# Patient Record
Sex: Female | Born: 1945 | Race: White | Hispanic: No | Marital: Married | Smoking: Current every day smoker
Health system: Southern US, Community
[De-identification: ages and names within clinical notes are randomized; demographics above are authoritative.]

## PROBLEM LIST (undated history)

## (undated) DIAGNOSIS — E538 Deficiency of other specified B group vitamins: Secondary | ICD-10-CM

## (undated) DIAGNOSIS — N3000 Acute cystitis without hematuria: Secondary | ICD-10-CM

## (undated) DIAGNOSIS — R7309 Other abnormal glucose: Secondary | ICD-10-CM

## (undated) DIAGNOSIS — D1800 Hemangioma unspecified site: Secondary | ICD-10-CM

## (undated) DIAGNOSIS — M810 Age-related osteoporosis without current pathological fracture: Secondary | ICD-10-CM

## (undated) DIAGNOSIS — K5730 Diverticulosis of large intestine without perforation or abscess without bleeding: Secondary | ICD-10-CM

## (undated) DIAGNOSIS — H9209 Otalgia, unspecified ear: Secondary | ICD-10-CM

## (undated) DIAGNOSIS — R0989 Other specified symptoms and signs involving the circulatory and respiratory systems: Secondary | ICD-10-CM

## (undated) DIAGNOSIS — IMO0001 Reserved for inherently not codable concepts without codable children: Secondary | ICD-10-CM

## (undated) DIAGNOSIS — I10 Essential (primary) hypertension: Secondary | ICD-10-CM

## (undated) DIAGNOSIS — J449 Chronic obstructive pulmonary disease, unspecified: Secondary | ICD-10-CM

## (undated) DIAGNOSIS — K589 Irritable bowel syndrome without diarrhea: Secondary | ICD-10-CM

## (undated) DIAGNOSIS — F17200 Nicotine dependence, unspecified, uncomplicated: Secondary | ICD-10-CM

## (undated) DIAGNOSIS — K219 Gastro-esophageal reflux disease without esophagitis: Secondary | ICD-10-CM

## (undated) DIAGNOSIS — J309 Allergic rhinitis, unspecified: Secondary | ICD-10-CM

## (undated) DIAGNOSIS — K648 Other hemorrhoids: Secondary | ICD-10-CM

## (undated) DIAGNOSIS — R079 Chest pain, unspecified: Secondary | ICD-10-CM

## (undated) DIAGNOSIS — G5600 Carpal tunnel syndrome, unspecified upper limb: Secondary | ICD-10-CM

## (undated) DIAGNOSIS — M797 Fibromyalgia: Secondary | ICD-10-CM

## (undated) DIAGNOSIS — R209 Unspecified disturbances of skin sensation: Secondary | ICD-10-CM

## (undated) HISTORY — DX: Deficiency of other specified B group vitamins: E53.8

## (undated) HISTORY — DX: Other hemorrhoids: K64.8

## (undated) HISTORY — DX: Irritable bowel syndrome without diarrhea: K58.9

## (undated) HISTORY — PX: ABDOMINAL HYSTERECTOMY: SHX81

## (undated) HISTORY — PX: FOOT OSTEOTOMY: SHX957

## (undated) HISTORY — DX: Unspecified disturbances of skin sensation: R20.9

## (undated) HISTORY — DX: Otalgia, unspecified ear: H92.09

## (undated) HISTORY — DX: Gastro-esophageal reflux disease without esophagitis: K21.9

## (undated) HISTORY — DX: Diverticulosis of large intestine without perforation or abscess without bleeding: K57.30

## (undated) HISTORY — PX: SHOULDER SURGERY: SHX246

## (undated) HISTORY — DX: Carpal tunnel syndrome, unspecified upper limb: G56.00

## (undated) HISTORY — DX: Other abnormal glucose: R73.09

## (undated) HISTORY — DX: Fibromyalgia: M79.7

## (undated) HISTORY — DX: Irritable bowel syndrome, unspecified: K58.9

## (undated) HISTORY — DX: Essential (primary) hypertension: I10

## (undated) HISTORY — DX: Nicotine dependence, unspecified, uncomplicated: F17.200

## (undated) HISTORY — DX: Acute cystitis without hematuria: N30.00

## (undated) HISTORY — DX: Chronic obstructive pulmonary disease, unspecified: J44.9

## (undated) HISTORY — DX: Chest pain, unspecified: R07.9

## (undated) HISTORY — PX: CHOLECYSTECTOMY: SHX55

## (undated) HISTORY — DX: Reserved for inherently not codable concepts without codable children: IMO0001

## (undated) HISTORY — DX: Allergic rhinitis, unspecified: J30.9

## (undated) HISTORY — DX: Hemangioma unspecified site: D18.00

## (undated) HISTORY — DX: Other specified symptoms and signs involving the circulatory and respiratory systems: R09.89

## (undated) HISTORY — DX: Age-related osteoporosis without current pathological fracture: M81.0

---

## 1988-11-14 HISTORY — PX: REDUCTION MAMMAPLASTY: SUR839

## 1998-10-28 ENCOUNTER — Encounter: Payer: Self-pay | Admitting: Obstetrics and Gynecology

## 1998-10-28 ENCOUNTER — Ambulatory Visit (HOSPITAL_COMMUNITY): Admission: RE | Admit: 1998-10-28 | Discharge: 1998-10-28 | Payer: Self-pay | Admitting: Obstetrics and Gynecology

## 1999-11-01 ENCOUNTER — Ambulatory Visit (HOSPITAL_COMMUNITY): Admission: RE | Admit: 1999-11-01 | Discharge: 1999-11-01 | Payer: Self-pay | Admitting: Obstetrics and Gynecology

## 1999-11-01 ENCOUNTER — Encounter: Payer: Self-pay | Admitting: Obstetrics and Gynecology

## 2000-11-01 ENCOUNTER — Ambulatory Visit (HOSPITAL_COMMUNITY): Admission: RE | Admit: 2000-11-01 | Discharge: 2000-11-01 | Payer: Self-pay | Admitting: Obstetrics and Gynecology

## 2000-11-01 ENCOUNTER — Encounter: Payer: Self-pay | Admitting: Obstetrics and Gynecology

## 2001-03-06 ENCOUNTER — Encounter: Payer: Self-pay | Admitting: Obstetrics and Gynecology

## 2001-03-06 ENCOUNTER — Encounter: Admission: RE | Admit: 2001-03-06 | Discharge: 2001-03-06 | Payer: Self-pay | Admitting: Obstetrics and Gynecology

## 2001-11-02 ENCOUNTER — Ambulatory Visit (HOSPITAL_COMMUNITY): Admission: RE | Admit: 2001-11-02 | Discharge: 2001-11-02 | Payer: Self-pay | Admitting: Internal Medicine

## 2001-11-02 ENCOUNTER — Encounter: Payer: Self-pay | Admitting: Internal Medicine

## 2002-02-01 ENCOUNTER — Encounter: Admission: RE | Admit: 2002-02-01 | Discharge: 2002-05-02 | Payer: Self-pay | Admitting: Orthopedic Surgery

## 2002-04-05 ENCOUNTER — Other Ambulatory Visit: Admission: RE | Admit: 2002-04-05 | Discharge: 2002-04-05 | Payer: Self-pay | Admitting: Obstetrics and Gynecology

## 2002-10-09 ENCOUNTER — Ambulatory Visit (HOSPITAL_COMMUNITY): Admission: RE | Admit: 2002-10-09 | Discharge: 2002-10-09 | Payer: Self-pay | Admitting: Gastroenterology

## 2002-10-21 ENCOUNTER — Encounter: Payer: Self-pay | Admitting: Gastroenterology

## 2002-10-21 ENCOUNTER — Ambulatory Visit (HOSPITAL_COMMUNITY): Admission: RE | Admit: 2002-10-21 | Discharge: 2002-10-21 | Payer: Self-pay | Admitting: Gastroenterology

## 2002-11-04 ENCOUNTER — Ambulatory Visit (HOSPITAL_COMMUNITY): Admission: RE | Admit: 2002-11-04 | Discharge: 2002-11-04 | Payer: Self-pay | Admitting: Obstetrics and Gynecology

## 2002-11-04 ENCOUNTER — Encounter: Payer: Self-pay | Admitting: Obstetrics and Gynecology

## 2002-12-04 ENCOUNTER — Encounter (INDEPENDENT_AMBULATORY_CARE_PROVIDER_SITE_OTHER): Payer: Self-pay | Admitting: *Deleted

## 2002-12-04 ENCOUNTER — Ambulatory Visit (HOSPITAL_COMMUNITY): Admission: RE | Admit: 2002-12-04 | Discharge: 2002-12-05 | Payer: Self-pay | Admitting: Surgery

## 2002-12-04 ENCOUNTER — Encounter: Payer: Self-pay | Admitting: Surgery

## 2003-03-31 ENCOUNTER — Ambulatory Visit (HOSPITAL_COMMUNITY): Admission: RE | Admit: 2003-03-31 | Discharge: 2003-03-31 | Payer: Self-pay | Admitting: Gastroenterology

## 2003-03-31 ENCOUNTER — Encounter (INDEPENDENT_AMBULATORY_CARE_PROVIDER_SITE_OTHER): Payer: Self-pay | Admitting: *Deleted

## 2003-03-31 ENCOUNTER — Encounter (INDEPENDENT_AMBULATORY_CARE_PROVIDER_SITE_OTHER): Payer: Self-pay | Admitting: Specialist

## 2003-06-10 ENCOUNTER — Other Ambulatory Visit: Admission: RE | Admit: 2003-06-10 | Discharge: 2003-06-10 | Payer: Self-pay | Admitting: Obstetrics and Gynecology

## 2003-11-06 ENCOUNTER — Ambulatory Visit (HOSPITAL_COMMUNITY): Admission: RE | Admit: 2003-11-06 | Discharge: 2003-11-06 | Payer: Self-pay | Admitting: Obstetrics and Gynecology

## 2004-06-23 ENCOUNTER — Other Ambulatory Visit: Admission: RE | Admit: 2004-06-23 | Discharge: 2004-06-23 | Payer: Self-pay | Admitting: Obstetrics and Gynecology

## 2004-08-04 ENCOUNTER — Encounter: Admission: RE | Admit: 2004-08-04 | Discharge: 2004-08-04 | Payer: Self-pay | Admitting: Obstetrics and Gynecology

## 2004-10-06 ENCOUNTER — Emergency Department (HOSPITAL_COMMUNITY): Admission: EM | Admit: 2004-10-06 | Discharge: 2004-10-06 | Payer: Self-pay | Admitting: Family Medicine

## 2004-11-16 ENCOUNTER — Ambulatory Visit (HOSPITAL_COMMUNITY): Admission: RE | Admit: 2004-11-16 | Discharge: 2004-11-16 | Payer: Self-pay | Admitting: Obstetrics and Gynecology

## 2005-06-13 ENCOUNTER — Ambulatory Visit: Payer: Self-pay | Admitting: Internal Medicine

## 2005-07-12 ENCOUNTER — Ambulatory Visit: Payer: Self-pay | Admitting: Internal Medicine

## 2005-08-10 ENCOUNTER — Other Ambulatory Visit: Admission: RE | Admit: 2005-08-10 | Discharge: 2005-08-10 | Payer: Self-pay | Admitting: Obstetrics and Gynecology

## 2005-09-02 ENCOUNTER — Ambulatory Visit: Payer: Self-pay | Admitting: Internal Medicine

## 2005-11-23 ENCOUNTER — Ambulatory Visit (HOSPITAL_COMMUNITY): Admission: RE | Admit: 2005-11-23 | Discharge: 2005-11-23 | Payer: Self-pay | Admitting: Obstetrics and Gynecology

## 2006-02-20 ENCOUNTER — Emergency Department (HOSPITAL_COMMUNITY): Admission: EM | Admit: 2006-02-20 | Discharge: 2006-02-20 | Payer: Self-pay | Admitting: Family Medicine

## 2006-03-30 ENCOUNTER — Ambulatory Visit: Payer: Self-pay | Admitting: Internal Medicine

## 2006-04-03 ENCOUNTER — Ambulatory Visit: Payer: Self-pay | Admitting: Internal Medicine

## 2006-04-18 ENCOUNTER — Ambulatory Visit (HOSPITAL_COMMUNITY): Admission: RE | Admit: 2006-04-18 | Discharge: 2006-04-18 | Payer: Self-pay | Admitting: Gastroenterology

## 2006-05-24 ENCOUNTER — Encounter (INDEPENDENT_AMBULATORY_CARE_PROVIDER_SITE_OTHER): Payer: Self-pay | Admitting: *Deleted

## 2006-05-24 ENCOUNTER — Ambulatory Visit (HOSPITAL_COMMUNITY): Admission: RE | Admit: 2006-05-24 | Discharge: 2006-05-24 | Payer: Self-pay | Admitting: Gastroenterology

## 2006-05-24 LAB — HM COLONOSCOPY

## 2006-06-27 ENCOUNTER — Encounter: Payer: Self-pay | Admitting: Internal Medicine

## 2006-07-21 ENCOUNTER — Encounter (INDEPENDENT_AMBULATORY_CARE_PROVIDER_SITE_OTHER): Payer: Self-pay | Admitting: Specialist

## 2006-07-21 ENCOUNTER — Encounter (INDEPENDENT_AMBULATORY_CARE_PROVIDER_SITE_OTHER): Payer: Self-pay | Admitting: *Deleted

## 2006-07-21 ENCOUNTER — Ambulatory Visit (HOSPITAL_COMMUNITY): Admission: RE | Admit: 2006-07-21 | Discharge: 2006-07-21 | Payer: Self-pay | Admitting: Gastroenterology

## 2006-08-25 ENCOUNTER — Ambulatory Visit (HOSPITAL_COMMUNITY): Admission: RE | Admit: 2006-08-25 | Discharge: 2006-08-25 | Payer: Self-pay | Admitting: Obstetrics and Gynecology

## 2006-11-24 ENCOUNTER — Ambulatory Visit (HOSPITAL_COMMUNITY): Admission: RE | Admit: 2006-11-24 | Discharge: 2006-11-24 | Payer: Self-pay | Admitting: Obstetrics and Gynecology

## 2007-02-06 ENCOUNTER — Ambulatory Visit: Payer: Self-pay | Admitting: Internal Medicine

## 2007-02-06 LAB — CONVERTED CEMR LAB
ALT: 19 units/L (ref 0–40)
AST: 18 units/L (ref 0–37)
Alkaline Phosphatase: 92 units/L (ref 39–117)
BUN: 12 mg/dL (ref 6–23)
Basophils Relative: 1.5 % — ABNORMAL HIGH (ref 0.0–1.0)
Bilirubin, Direct: 0.1 mg/dL (ref 0.0–0.3)
CO2: 30 meq/L (ref 19–32)
Calcium: 9.1 mg/dL (ref 8.4–10.5)
Chloride: 105 meq/L (ref 96–112)
Crystals: NEGATIVE
Eosinophils Absolute: 0.3 10*3/uL (ref 0.0–0.6)
Eosinophils Relative: 5.4 % — ABNORMAL HIGH (ref 0.0–5.0)
GFR calc Af Amer: 132 mL/min
Glucose, Bld: 100 mg/dL — ABNORMAL HIGH (ref 70–99)
HCT: 39.8 % (ref 36.0–46.0)
Hemoglobin, Urine: NEGATIVE
Leukocytes, UA: NEGATIVE
MCV: 85.6 fL (ref 78.0–100.0)
Mucus, UA: NEGATIVE
Neutrophils Relative %: 52.4 % (ref 43.0–77.0)
Platelets: 238 10*3/uL (ref 150–400)
RBC / HPF: NONE SEEN
RBC: 4.65 M/uL (ref 3.87–5.11)
Total CHOL/HDL Ratio: 4
Total Protein, Urine: NEGATIVE mg/dL
Total Protein: 6.9 g/dL (ref 6.0–8.3)
VLDL: 30 mg/dL (ref 0–40)
WBC: 6 10*3/uL (ref 4.5–10.5)

## 2007-02-12 ENCOUNTER — Ambulatory Visit: Payer: Self-pay | Admitting: Internal Medicine

## 2007-02-13 ENCOUNTER — Ambulatory Visit: Payer: Self-pay | Admitting: Internal Medicine

## 2007-03-08 ENCOUNTER — Ambulatory Visit (HOSPITAL_COMMUNITY): Admission: RE | Admit: 2007-03-08 | Discharge: 2007-03-08 | Payer: Self-pay | Admitting: Internal Medicine

## 2007-03-16 ENCOUNTER — Ambulatory Visit: Payer: Self-pay | Admitting: Internal Medicine

## 2007-04-16 ENCOUNTER — Ambulatory Visit (HOSPITAL_COMMUNITY): Admission: RE | Admit: 2007-04-16 | Discharge: 2007-04-16 | Payer: Self-pay | Admitting: Neurosurgery

## 2007-07-13 ENCOUNTER — Emergency Department (HOSPITAL_COMMUNITY): Admission: EM | Admit: 2007-07-13 | Discharge: 2007-07-13 | Payer: Self-pay | Admitting: Emergency Medicine

## 2007-08-30 ENCOUNTER — Telehealth (INDEPENDENT_AMBULATORY_CARE_PROVIDER_SITE_OTHER): Payer: Self-pay | Admitting: *Deleted

## 2007-08-30 ENCOUNTER — Ambulatory Visit: Payer: Self-pay | Admitting: Internal Medicine

## 2007-08-30 LAB — CONVERTED CEMR LAB
Crystals: NEGATIVE
Mucus, UA: NEGATIVE
Urobilinogen, UA: 1 (ref 0.0–1.0)

## 2007-08-31 ENCOUNTER — Telehealth: Payer: Self-pay | Admitting: Internal Medicine

## 2007-08-31 DIAGNOSIS — N390 Urinary tract infection, site not specified: Secondary | ICD-10-CM

## 2007-09-25 ENCOUNTER — Encounter: Payer: Self-pay | Admitting: Internal Medicine

## 2007-09-25 DIAGNOSIS — IMO0001 Reserved for inherently not codable concepts without codable children: Secondary | ICD-10-CM

## 2007-09-25 DIAGNOSIS — K219 Gastro-esophageal reflux disease without esophagitis: Secondary | ICD-10-CM | POA: Insufficient documentation

## 2007-09-25 DIAGNOSIS — I10 Essential (primary) hypertension: Secondary | ICD-10-CM | POA: Insufficient documentation

## 2007-09-25 DIAGNOSIS — M810 Age-related osteoporosis without current pathological fracture: Secondary | ICD-10-CM

## 2007-09-25 HISTORY — DX: Reserved for inherently not codable concepts without codable children: IMO0001

## 2007-09-25 HISTORY — DX: Age-related osteoporosis without current pathological fracture: M81.0

## 2007-09-25 HISTORY — DX: Gastro-esophageal reflux disease without esophagitis: K21.9

## 2007-09-25 HISTORY — DX: Essential (primary) hypertension: I10

## 2007-09-28 ENCOUNTER — Telehealth: Payer: Self-pay | Admitting: Internal Medicine

## 2007-10-01 ENCOUNTER — Encounter: Payer: Self-pay | Admitting: Internal Medicine

## 2007-11-22 ENCOUNTER — Telehealth: Payer: Self-pay | Admitting: Internal Medicine

## 2007-11-23 ENCOUNTER — Telehealth: Payer: Self-pay | Admitting: Internal Medicine

## 2007-11-23 ENCOUNTER — Ambulatory Visit: Payer: Self-pay | Admitting: Internal Medicine

## 2007-11-23 LAB — CONVERTED CEMR LAB
Crystals: NEGATIVE
Nitrite: POSITIVE — AB
Specific Gravity, Urine: 1.025 (ref 1.000–1.03)
Total Protein, Urine: 100 mg/dL — AB

## 2007-11-26 ENCOUNTER — Ambulatory Visit (HOSPITAL_COMMUNITY): Admission: RE | Admit: 2007-11-26 | Discharge: 2007-11-26 | Payer: Self-pay | Admitting: Obstetrics and Gynecology

## 2007-12-18 ENCOUNTER — Encounter: Payer: Self-pay | Admitting: Internal Medicine

## 2007-12-25 ENCOUNTER — Ambulatory Visit: Payer: Self-pay | Admitting: Internal Medicine

## 2007-12-25 DIAGNOSIS — N3000 Acute cystitis without hematuria: Secondary | ICD-10-CM

## 2007-12-25 HISTORY — DX: Acute cystitis without hematuria: N30.00

## 2007-12-25 LAB — CONVERTED CEMR LAB
Bilirubin Urine: NEGATIVE
Nitrite: NEGATIVE
RBC / HPF: NONE SEEN
Specific Gravity, Urine: <=1.005 (ref 1.000–1.03)
pH: 6.5 (ref 5.0–8.0)

## 2008-01-15 ENCOUNTER — Telehealth: Payer: Self-pay | Admitting: Internal Medicine

## 2008-01-24 ENCOUNTER — Ambulatory Visit (HOSPITAL_COMMUNITY): Admission: RE | Admit: 2008-01-24 | Discharge: 2008-01-24 | Payer: Self-pay | Admitting: Urology

## 2008-02-18 ENCOUNTER — Ambulatory Visit: Payer: Self-pay | Admitting: Internal Medicine

## 2008-02-20 LAB — CONVERTED CEMR LAB
AST: 19 units/L (ref 0–37)
Albumin: 3.8 g/dL (ref 3.5–5.2)
BUN: 14 mg/dL (ref 6–23)
Basophils Relative: 0.9 % (ref 0.0–1.0)
Cholesterol: 201 mg/dL (ref 0–200)
Creatinine, Ser: 0.7 mg/dL (ref 0.4–1.2)
Crystals: NEGATIVE
Direct LDL: 137.6 mg/dL
Eosinophils Absolute: 0.4 10*3/uL (ref 0.0–0.7)
Eosinophils Relative: 6.6 % — ABNORMAL HIGH (ref 0.0–5.0)
GFR calc Af Amer: 110 mL/min
GFR calc non Af Amer: 91 mL/min
HCT: 40.6 % (ref 36.0–46.0)
HDL: 40.5 mg/dL (ref >39.0)
Hemoglobin, Urine: NEGATIVE
MCV: 87.9 fL (ref 78.0–100.0)
Monocytes Absolute: 0.4 10*3/uL (ref 0.1–1.0)
RBC: 4.62 M/uL (ref 3.87–5.11)
Specific Gravity, Urine: 1.015 (ref 1.000–1.03)
Total Protein, Urine: NEGATIVE mg/dL
Urine Glucose: NEGATIVE mg/dL
WBC: 6.3 10*3/uL (ref 4.5–10.5)

## 2008-02-25 ENCOUNTER — Ambulatory Visit: Payer: Self-pay | Admitting: Internal Medicine

## 2008-02-25 DIAGNOSIS — F17200 Nicotine dependence, unspecified, uncomplicated: Secondary | ICD-10-CM

## 2008-02-25 DIAGNOSIS — K5730 Diverticulosis of large intestine without perforation or abscess without bleeding: Secondary | ICD-10-CM | POA: Insufficient documentation

## 2008-02-25 DIAGNOSIS — R0989 Other specified symptoms and signs involving the circulatory and respiratory systems: Secondary | ICD-10-CM

## 2008-02-25 HISTORY — DX: Nicotine dependence, unspecified, uncomplicated: F17.200

## 2008-02-25 HISTORY — DX: Other specified symptoms and signs involving the circulatory and respiratory systems: R09.89

## 2008-02-25 HISTORY — DX: Diverticulosis of large intestine without perforation or abscess without bleeding: K57.30

## 2008-02-27 ENCOUNTER — Telehealth: Payer: Self-pay | Admitting: Internal Medicine

## 2008-03-06 ENCOUNTER — Ambulatory Visit: Payer: Self-pay

## 2008-03-14 ENCOUNTER — Encounter: Admission: RE | Admit: 2008-03-14 | Discharge: 2008-03-14 | Payer: Self-pay | Admitting: Obstetrics and Gynecology

## 2008-03-31 ENCOUNTER — Ambulatory Visit: Payer: Self-pay | Admitting: Internal Medicine

## 2008-04-03 ENCOUNTER — Telehealth: Payer: Self-pay | Admitting: Internal Medicine

## 2008-04-14 ENCOUNTER — Telehealth: Payer: Self-pay | Admitting: Internal Medicine

## 2008-05-15 DIAGNOSIS — K648 Other hemorrhoids: Secondary | ICD-10-CM

## 2008-05-15 HISTORY — DX: Other hemorrhoids: K64.8

## 2008-05-19 ENCOUNTER — Ambulatory Visit: Payer: Self-pay | Admitting: Internal Medicine

## 2008-05-19 DIAGNOSIS — K589 Irritable bowel syndrome without diarrhea: Secondary | ICD-10-CM

## 2008-05-19 HISTORY — DX: Irritable bowel syndrome, unspecified: K58.9

## 2008-05-21 ENCOUNTER — Telehealth: Payer: Self-pay | Admitting: Internal Medicine

## 2008-12-22 ENCOUNTER — Telehealth: Payer: Self-pay | Admitting: Internal Medicine

## 2008-12-23 ENCOUNTER — Ambulatory Visit: Payer: Self-pay | Admitting: Internal Medicine

## 2008-12-23 DIAGNOSIS — G5600 Carpal tunnel syndrome, unspecified upper limb: Secondary | ICD-10-CM

## 2008-12-23 DIAGNOSIS — R209 Unspecified disturbances of skin sensation: Secondary | ICD-10-CM

## 2008-12-23 HISTORY — DX: Carpal tunnel syndrome, unspecified upper limb: G56.00

## 2008-12-23 HISTORY — DX: Unspecified disturbances of skin sensation: R20.9

## 2009-01-14 ENCOUNTER — Ambulatory Visit: Payer: Self-pay | Admitting: Internal Medicine

## 2009-01-15 LAB — CONVERTED CEMR LAB
BUN: 18 mg/dL (ref 6–23)
Calcium: 8.7 mg/dL (ref 8.4–10.5)
GFR calc Af Amer: 109 mL/min
GFR calc non Af Amer: 90 mL/min
Potassium: 3.4 meq/L — ABNORMAL LOW (ref 3.5–5.1)
Sodium: 140 meq/L (ref 135–145)

## 2009-01-21 ENCOUNTER — Ambulatory Visit: Payer: Self-pay | Admitting: Internal Medicine

## 2009-01-21 DIAGNOSIS — E538 Deficiency of other specified B group vitamins: Secondary | ICD-10-CM | POA: Insufficient documentation

## 2009-01-21 HISTORY — DX: Deficiency of other specified B group vitamins: E53.8

## 2009-01-30 ENCOUNTER — Telehealth: Payer: Self-pay | Admitting: Internal Medicine

## 2009-04-06 ENCOUNTER — Telehealth: Payer: Self-pay | Admitting: Internal Medicine

## 2009-04-17 ENCOUNTER — Telehealth: Payer: Self-pay | Admitting: Internal Medicine

## 2009-05-04 ENCOUNTER — Telehealth: Payer: Self-pay | Admitting: Internal Medicine

## 2009-05-14 ENCOUNTER — Ambulatory Visit: Payer: Self-pay | Admitting: Internal Medicine

## 2009-05-14 LAB — CONVERTED CEMR LAB
BUN: 18 mg/dL (ref 6–23)
Calcium: 9.1 mg/dL (ref 8.4–10.5)
Creatinine, Ser: 0.8 mg/dL (ref 0.4–1.2)
GFR calc non Af Amer: 77.36 mL/min (ref >60)
Glucose, Bld: 105 mg/dL — ABNORMAL HIGH (ref 70–99)
Sodium: 142 meq/L (ref 135–145)

## 2009-05-21 ENCOUNTER — Emergency Department (HOSPITAL_COMMUNITY): Admission: EM | Admit: 2009-05-21 | Discharge: 2009-05-21 | Payer: Self-pay | Admitting: Family Medicine

## 2009-05-25 ENCOUNTER — Ambulatory Visit: Payer: Self-pay | Admitting: Internal Medicine

## 2009-06-26 ENCOUNTER — Telehealth: Payer: Self-pay | Admitting: Internal Medicine

## 2009-08-14 ENCOUNTER — Ambulatory Visit: Payer: Self-pay | Admitting: Family Medicine

## 2009-08-24 ENCOUNTER — Telehealth: Payer: Self-pay | Admitting: Internal Medicine

## 2009-10-19 ENCOUNTER — Encounter: Payer: Self-pay | Admitting: Internal Medicine

## 2009-10-22 ENCOUNTER — Telehealth: Payer: Self-pay | Admitting: Internal Medicine

## 2009-11-14 DIAGNOSIS — R7309 Other abnormal glucose: Secondary | ICD-10-CM

## 2009-11-14 HISTORY — DX: Other abnormal glucose: R73.09

## 2009-11-23 ENCOUNTER — Ambulatory Visit: Payer: Self-pay | Admitting: Internal Medicine

## 2009-11-23 LAB — CONVERTED CEMR LAB
ALT: 43 units/L — ABNORMAL HIGH (ref 0–35)
Albumin: 3.9 g/dL (ref 3.5–5.2)
Basophils Relative: 1.5 % (ref 0.0–3.0)
Bilirubin Urine: NEGATIVE
CO2: 33 meq/L — ABNORMAL HIGH (ref 19–32)
Chloride: 106 meq/L (ref 96–112)
Eosinophils Absolute: 0.4 10*3/uL (ref 0.0–0.7)
Eosinophils Relative: 5.8 % — ABNORMAL HIGH (ref 0.0–5.0)
HCT: 41.8 % (ref 36.0–46.0)
Hemoglobin, Urine: NEGATIVE
Hemoglobin: 13.8 g/dL (ref 12.0–15.0)
MCHC: 33.1 g/dL (ref 30.0–36.0)
MCV: 91.5 fL (ref 78.0–100.0)
Monocytes Absolute: 0.4 10*3/uL (ref 0.1–1.0)
Neutro Abs: 4.1 10*3/uL (ref 1.4–7.7)
Nitrite: NEGATIVE
Potassium: 4.3 meq/L (ref 3.5–5.1)
RBC: 4.57 M/uL (ref 3.87–5.11)
Sodium: 145 meq/L (ref 135–145)
Total CHOL/HDL Ratio: 4
Total Protein: 7.1 g/dL (ref 6.0–8.3)
Triglycerides: 97 mg/dL (ref 0.0–149.0)
Urine Glucose: NEGATIVE mg/dL
Urobilinogen, UA: 0.2 (ref 0.0–1.0)
Vitamin B-12: 501 pg/mL (ref 211–911)

## 2009-11-27 ENCOUNTER — Encounter: Payer: Self-pay | Admitting: Internal Medicine

## 2009-11-30 ENCOUNTER — Ambulatory Visit: Payer: Self-pay | Admitting: Internal Medicine

## 2009-11-30 DIAGNOSIS — J309 Allergic rhinitis, unspecified: Secondary | ICD-10-CM | POA: Insufficient documentation

## 2009-11-30 HISTORY — DX: Allergic rhinitis, unspecified: J30.9

## 2010-03-26 ENCOUNTER — Telehealth: Payer: Self-pay | Admitting: Internal Medicine

## 2010-04-19 ENCOUNTER — Encounter: Payer: Self-pay | Admitting: Internal Medicine

## 2010-05-25 ENCOUNTER — Ambulatory Visit: Payer: Self-pay | Admitting: Internal Medicine

## 2010-05-25 LAB — CONVERTED CEMR LAB
ALT: 24 units/L (ref 0–35)
BUN: 16 mg/dL (ref 6–23)
Bilirubin, Direct: 0.1 mg/dL (ref 0.0–0.3)
CO2: 30 meq/L (ref 19–32)
Calcium: 9.1 mg/dL (ref 8.4–10.5)
Chloride: 106 meq/L (ref 96–112)
Creatinine, Ser: 0.7 mg/dL (ref 0.4–1.2)
Hgb A1c MFr Bld: 6.3 % (ref 4.6–6.5)
Total Protein: 6.7 g/dL (ref 6.0–8.3)

## 2010-05-31 ENCOUNTER — Ambulatory Visit: Payer: Self-pay | Admitting: Internal Medicine

## 2010-05-31 DIAGNOSIS — R079 Chest pain, unspecified: Secondary | ICD-10-CM

## 2010-05-31 DIAGNOSIS — R7309 Other abnormal glucose: Secondary | ICD-10-CM

## 2010-05-31 HISTORY — DX: Chest pain, unspecified: R07.9

## 2010-05-31 HISTORY — DX: Other abnormal glucose: R73.09

## 2010-06-01 ENCOUNTER — Telehealth (INDEPENDENT_AMBULATORY_CARE_PROVIDER_SITE_OTHER): Payer: Self-pay | Admitting: *Deleted

## 2010-06-09 ENCOUNTER — Telehealth (INDEPENDENT_AMBULATORY_CARE_PROVIDER_SITE_OTHER): Payer: Self-pay | Admitting: *Deleted

## 2010-06-10 ENCOUNTER — Encounter (HOSPITAL_COMMUNITY): Admission: RE | Admit: 2010-06-10 | Discharge: 2010-08-16 | Payer: Self-pay | Admitting: Internal Medicine

## 2010-06-10 ENCOUNTER — Encounter: Payer: Self-pay | Admitting: Cardiology

## 2010-06-10 ENCOUNTER — Ambulatory Visit: Payer: Self-pay | Admitting: Cardiology

## 2010-06-10 ENCOUNTER — Ambulatory Visit: Payer: Self-pay

## 2010-06-10 ENCOUNTER — Encounter (INDEPENDENT_AMBULATORY_CARE_PROVIDER_SITE_OTHER): Payer: Self-pay | Admitting: *Deleted

## 2010-06-22 ENCOUNTER — Telehealth: Payer: Self-pay | Admitting: Internal Medicine

## 2010-07-26 ENCOUNTER — Telehealth (INDEPENDENT_AMBULATORY_CARE_PROVIDER_SITE_OTHER): Payer: Self-pay | Admitting: *Deleted

## 2010-11-26 ENCOUNTER — Ambulatory Visit (HOSPITAL_COMMUNITY)
Admission: RE | Admit: 2010-11-26 | Discharge: 2010-11-26 | Payer: Self-pay | Source: Home / Self Care | Attending: Obstetrics and Gynecology | Admitting: Obstetrics and Gynecology

## 2010-12-02 ENCOUNTER — Ambulatory Visit
Admission: RE | Admit: 2010-12-02 | Discharge: 2010-12-02 | Payer: Self-pay | Source: Home / Self Care | Attending: Internal Medicine | Admitting: Internal Medicine

## 2010-12-02 ENCOUNTER — Other Ambulatory Visit: Payer: Self-pay | Admitting: Internal Medicine

## 2010-12-02 LAB — HEPATIC FUNCTION PANEL
ALT: 31 U/L (ref 0–35)
AST: 25 U/L (ref 0–37)
Albumin: 4 g/dL (ref 3.5–5.2)
Alkaline Phosphatase: 90 U/L (ref 39–117)
Bilirubin, Direct: 0.1 mg/dL (ref 0.0–0.3)
Total Bilirubin: 0.6 mg/dL (ref 0.3–1.2)
Total Protein: 6.9 g/dL (ref 6.0–8.3)

## 2010-12-02 LAB — LIPID PANEL
Cholesterol: 190 mg/dL (ref 0–200)
HDL: 40.4 mg/dL (ref >39.00)
LDL Cholesterol: 123 mg/dL — ABNORMAL HIGH (ref 0–99)
Total CHOL/HDL Ratio: 5
Triglycerides: 134 mg/dL (ref 0.0–149.0)
VLDL: 26.8 mg/dL (ref 0.0–40.0)

## 2010-12-02 LAB — CBC WITH DIFFERENTIAL/PLATELET
Basophils Absolute: 0.1 10*3/uL (ref 0.0–0.1)
Basophils Relative: 1.2 % (ref 0.0–3.0)
Eosinophils Absolute: 0.4 10*3/uL (ref 0.0–0.7)
Eosinophils Relative: 7.6 % — ABNORMAL HIGH (ref 0.0–5.0)
HCT: 38.5 % (ref 36.0–46.0)
Hemoglobin: 13.2 g/dL (ref 12.0–15.0)
Lymphocytes Relative: 31.5 % (ref 12.0–46.0)
Lymphs Abs: 1.8 10*3/uL (ref 0.7–4.0)
MCHC: 34.4 g/dL (ref 30.0–36.0)
MCV: 89 fl (ref 78.0–100.0)
Monocytes Absolute: 0.4 10*3/uL (ref 0.1–1.0)
Monocytes Relative: 6.4 % (ref 3.0–12.0)
Neutro Abs: 3 10*3/uL (ref 1.4–7.7)
Neutrophils Relative %: 53.3 % (ref 43.0–77.0)
Platelets: 202 10*3/uL (ref 150.0–400.0)
RBC: 4.33 Mil/uL (ref 3.87–5.11)
RDW: 12.5 % (ref 11.5–14.6)
WBC: 5.6 10*3/uL (ref 4.5–10.5)

## 2010-12-02 LAB — URINALYSIS
Ketones, ur: NEGATIVE
Leukocytes, UA: NEGATIVE
Nitrite: NEGATIVE
Specific Gravity, Urine: 1.02 (ref 1.000–1.030)
Total Protein, Urine: NEGATIVE
Urine Glucose: NEGATIVE
pH: 7 (ref 5.0–8.0)

## 2010-12-02 LAB — VITAMIN B12: Vitamin B-12: 359 pg/mL (ref 211–911)

## 2010-12-02 LAB — BASIC METABOLIC PANEL
BUN: 16 mg/dL (ref 6–23)
CO2: 31 mEq/L (ref 19–32)
Calcium: 9.1 mg/dL (ref 8.4–10.5)
Chloride: 102 mEq/L (ref 96–112)
Creatinine, Ser: 0.5 mg/dL (ref 0.4–1.2)
GFR: 121.15 mL/min (ref >60.00)
Glucose, Bld: 114 mg/dL — ABNORMAL HIGH (ref 70–99)
Potassium: 4.2 mEq/L (ref 3.5–5.1)
Sodium: 141 mEq/L (ref 135–145)

## 2010-12-02 LAB — TSH: TSH: 2.1 u[IU]/mL (ref 0.35–5.50)

## 2010-12-02 LAB — HEMOGLOBIN A1C: Hgb A1c MFr Bld: 6.3 % (ref 4.6–6.5)

## 2010-12-04 ENCOUNTER — Encounter: Payer: Self-pay | Admitting: Obstetrics and Gynecology

## 2010-12-05 ENCOUNTER — Encounter: Payer: Self-pay | Admitting: Internal Medicine

## 2010-12-06 ENCOUNTER — Ambulatory Visit
Admission: RE | Admit: 2010-12-06 | Discharge: 2010-12-06 | Payer: Self-pay | Source: Home / Self Care | Attending: Internal Medicine | Admitting: Internal Medicine

## 2010-12-06 ENCOUNTER — Ambulatory Visit: Admit: 2010-12-06 | Payer: Self-pay | Admitting: Internal Medicine

## 2010-12-06 DIAGNOSIS — J449 Chronic obstructive pulmonary disease, unspecified: Secondary | ICD-10-CM | POA: Insufficient documentation

## 2010-12-06 DIAGNOSIS — J4489 Other specified chronic obstructive pulmonary disease: Secondary | ICD-10-CM | POA: Insufficient documentation

## 2010-12-06 DIAGNOSIS — H9209 Otalgia, unspecified ear: Secondary | ICD-10-CM

## 2010-12-06 HISTORY — DX: Other specified chronic obstructive pulmonary disease: J44.89

## 2010-12-06 HISTORY — DX: Otalgia, unspecified ear: H92.09

## 2010-12-06 HISTORY — DX: Chronic obstructive pulmonary disease, unspecified: J44.9

## 2010-12-10 ENCOUNTER — Ambulatory Visit
Admission: RE | Admit: 2010-12-10 | Discharge: 2010-12-10 | Payer: Self-pay | Source: Home / Self Care | Attending: Internal Medicine | Admitting: Internal Medicine

## 2010-12-10 ENCOUNTER — Encounter (INDEPENDENT_AMBULATORY_CARE_PROVIDER_SITE_OTHER): Payer: Self-pay | Admitting: *Deleted

## 2010-12-14 NOTE — Assessment & Plan Note (Signed)
Summary: 6 MOS WELL/$50/CD   Vital Signs:  Patient profile:   65 year old female Weight:      183 pounds Temp:     99.8 degrees F oral Pulse rate:   72 / minute BP sitting:   132 / 90  (left arm)  Vitals Entered By: Tora Perches (November 30, 2009 8:41 AM) CC: cpx Is Patient Diabetic? No   Primary Care Provider:  Dr. Danae Orleans Plotnikov  CC:  cpx.  History of Present Illness: The patient presents for a wellness examination. C/o sinus congestion, clear drainage.  Current Medications (verified): 1)  Baby Aspirin 81 Mg  Chew (Aspirin) .... Take 1 Tablet By Mouth Once A Day 2)  Loratadine 10 Mg  Tabs (Loratadine) .... Once Daily As Needed Allergies 3)  Prilosec Otc 20 Mg  Tbec (Omeprazole Magnesium) .... Take 1 Tablet By Mouth Once A Day 4)  Vitamin D3 1000 Unit  Tabs (Cholecalciferol) .... Take 1 Tablet By Mouth Once A Day 5)  Aleve 220 Mg  Tabs (Naproxen Sodium) .... Take 1 Tablet By Mouth Two Times A Day 6)  Nexium 40 Mg  Cpdr (Esomeprazole Magnesium) .Marland Kitchen.. 1 Once Daily 7)  Vitamin D 1000 Unit Tabs (Cholecalciferol) .... Once Daily 8)  Magnesium Aspartate 65 Mg Tabs (Magnesium) .... Once Daily 9)  Carvedilol 6.25 Mg Tabs (Carvedilol) .Marland Kitchen.. 1 By Mouth Bid 10)  Stool Softner .... Take 1 Tablet By Mouth Once A Day 11)  Vitamin B-12 Cr 1000 Mcg  Tbcr (Cyanocobalamin) .... Take One Half Tablet By Mouth Daily 12)  Spironolactone 25 Mg Tabs (Spironolactone) .Marland Kitchen.. 1 By Mouth Qam 13)  Cymbalta 60 Mg Cpep (Duloxetine Hcl) .... Take One Capsule Daily 14)  Zolpidem Tartrate 10 Mg  Tabs (Zolpidem Tartrate) .... 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   Past Medical History: GERD Hypertension Osteoporosis FMS IBS Diverticulosis, colon Dr [address], Colon, EGD 2007 Gyn Dr Adalberto Ill T10 hemangioma Dr Jule Ser CTS B Low nl Vit B12 2010 Allergic rhinitis  Review of Systems  The patient denies anorexia, fever, weight loss, weight gain, vision loss, decreased hearing, hoarseness, chest pain, syncope, dyspnea on exertion, peripheral edema, prolonged cough, headaches, hemoptysis, abdominal pain, melena, hematochezia, severe indigestion/heartburn, hematuria, incontinence, genital sores, muscle weakness, suspicious skin lesions, transient blindness, difficulty walking, depression, unusual weight change, abnormal bleeding, enlarged lymph nodes, angioedema, and breast masses.    Physical Exam  General:  Well developed, , no acute distress, overweight-appearing.   Head:  Normocephalic and atraumatic without obvious abnormalities. No apparent alopecia or balding. Eyes:  No corneal or conjunctival inflammation noted. EOMI. Perrla.  Ears:  wax Nose:  No deformity, discharge,  or lesions. Mouth:  Oral mucosa and oropharynx without lesions or exudates.  Teeth in good repair. Neck:  Supple; no masses or thyromegaly. Lungs:  Normal respiratory effort, chest expands symmetrically. Lungs are clear to auscultation, no crackles or wheezes.  Heart:  Normal rate and regular rhythm. S1 and S2 normal without gallop, murmur, click, rub or other extra sounds. Abdomen:  Bowel sounds positive,abdomen soft and non-tender without masses, organomegaly or hernias noted. Msk:  No deformity or scoliosis noted of thoracic or lumbar spine.     Extremities:  No clubbing, cyanosis, edema, or deformity noted with normal full range of motion of all joints.   Neurologic:  WNL Skin:  Intact without suspicious lesions or rashes Psych:  Alert and cooperative. Normal mood and affect.   Impression & Recommendations:  Problem # 1:  PHYSICAL EXAMINATION (ICD-V70.0) Assessment New The labs were reviewed with the patient.  Health and age related issues were discussed. Available screening tests and vaccinations were discussed as well. Healthy life style including good diet and execise was discussed.   Problem # 2:  ALLERGIC RHINITIS (ICD-477.9) Assessment: Deteriorated  Her updated medication list for this problem includes:    Loratadine 10 Mg Tabs (Loratadine) ..... Once daily as needed allergies    Flonase 50 Mcg/act Susp (Fluticasone propionate) .Marland Kitchen... 1 spr each nostr qd as needed  Complete Medication List: 1)  Baby Aspirin 81 Mg Chew (Aspirin) .... Take 1 tablet by mouth once a day 2)  Loratadine 10 Mg Tabs (Loratadine) .... Once daily as needed allergies 3)  Prilosec Otc 20 Mg Tbec (Omeprazole magnesium) .... Take 1 tablet by mouth once a day 4)  Vitamin D3 1000 Unit Tabs (Cholecalciferol) .... Take 1 tablet by mouth once a day 5)  Aleve 220 Mg Tabs (Naproxen sodium) .... Take 1 tablet by mouth two times a day 6)  Nexium 40 Mg Cpdr (Esomeprazole magnesium) .Marland Kitchen.. 1 once daily 7)  Vitamin D 1000 Unit Tabs (Cholecalciferol) .... Once daily 8)  Magnesium Aspartate 65 Mg Tabs (Magnesium) .... Once daily 9)  Carvedilol 6.25 Mg Tabs (Carvedilol) .Marland Kitchen.. 1 by mouth bid 10)  Stool Softner  .... Take 1 tablet by mouth once a day 11)  Vitamin B-12 Cr 1000 Mcg Tbcr (Cyanocobalamin) .... Take one half tablet by mouth daily 12)  Spironolactone 25 Mg Tabs (Spironolactone) .Marland Kitchen.. 1 by mouth qam 13)  Cymbalta 60 Mg Cpep (Duloxetine hcl) .... Take one capsule daily 14)  Zolpidem Tartrate 10 Mg Tabs (Zolpidem tartrate) .... 1/2 or 1 by mouth at hs  prn 15)  Flonase 50 Mcg/act Susp (Fluticasone propionate) .Marland Kitchen.. 1 spr each nostr qd as needed 16)  Zithromax Z-pak 250 Mg Tabs (Azithromycin) .... As dirrected  Patient Instructions: 1)  Use the Sinus rinse as needed 2)  Use stretching exercises that I have provided (15 min. or longer every day) 3)  Please schedule a follow-up appointment in 6 months. 4)  BMP prior to visit, ICD-9: 5)  HbgA1C prior to visit, ICD-9:790.29  995.20 6)  Hepatic Panel prior to visit, ICD-9: Prescriptions: ZITHROMAX Z-PAK 250 MG TABS (AZITHROMYCIN) as dirrected  #1 x 0   Entered and Authorized by:   Tresa Garter MD   Signed by:   Tresa Garter MD on 11/30/2009   Method used:   Print then Give to Patient   RxID:   [id number] FLONASE 50 MCG/ACT SUSP (FLUTICASONE PROPIONATE) 1 spr each nostr qd as needed  #3 x 3   Entered and Authorized by:   Tresa Garter MD   Signed by:   Tresa Garter MD on 11/30/2009   Method used:   Print then Give to Patient   RxID:   [id number]

## 2010-12-14 NOTE — Letter (Signed)
Summary: GI/[facility]  GI/[facility]   Imported By: Sherian Rein 05/19/2010 14:19:14  _____________________________________________________________________  External Attachment:    Type:   Image     Comment:   External Document

## 2010-12-14 NOTE — Progress Notes (Signed)
Summary: Rf Ambien  Phone Note Refill Request Message from:  Fax from Pharmacy  Refills Requested: Medication #1:  ZOLPIDEM TARTRATE 10 MG  TABS 1/2 or 1 by mouth at hs prn   Dosage confirmed as above?Dosage Confirmed   Supply Requested: 30  Method Requested: Telephone to Pharmacy Initial call taken by: Lanier Prude, [facility]),  July 26, 2010 12:00 PM  Follow-up for Phone Call        ok x6 Follow-up by: Tresa Garter MD,  July 26, 2010 4:47 PM    Prescriptions: ZOLPIDEM TARTRATE 10 MG  TABS (ZOLPIDEM TARTRATE) 1/2 or 1 by mouth at hs prn  #30 x 5   Entered by:   Ami Bullins CMA   Authorized by:   Tresa Garter MD   Signed by:   Bill Salinas CMA on 07/26/2010   Method used:   Telephoned to ...       [facility]* (retail)       [address]       [address] Shipping/mailing       [place], [place]  [address]       Ph: [phone]       Fax: [phone]   RxID:   [id number]

## 2010-12-14 NOTE — Progress Notes (Signed)
Summary: Zolpidem refill  Phone Note Refill Request Message from:  Fax from Pharmacy on Mar 26, 2010 11:04 AM  Refills Requested: Medication #1:  ZOLPIDEM TARTRATE 10 MG  TABS 1/2 or 1 by mouth at hs prn Next Appointment Scheduled: 05/2010 Initial call taken by: Lucious Groves,  Mar 26, 2010 11:04 AM  Follow-up for Phone Call        ok 3 ref Follow-up by: Tresa Garter MD,  Mar 26, 2010 1:12 PM  Additional Follow-up for Phone Call Additional follow up Details #1::        done. Additional Follow-up by: Lucious Groves,  Mar 26, 2010 2:25 PM    Prescriptions: ZOLPIDEM TARTRATE 10 MG  TABS (ZOLPIDEM TARTRATE) 1/2 or 1 by mouth at hs prn  #30 x 3   Entered by:   Lucious Groves   Authorized by:   Tresa Garter MD   Signed by:   Lucious Groves on 03/26/2010   Method used:   Telephoned to ...       [facility]  [address]  [phone]* (retail)       [address]       [place], [place]  [address]       Ph: [phone] or [phone]       Fax: [phone]   RxID:   [id number]

## 2010-12-14 NOTE — Progress Notes (Signed)
Summary: RESULTS  Phone Note Call from Patient Call back at Work Phone [phone]   Summary of Call: Patient is requesting results of stress test. Initial call taken by: Lamar Sprinkles, CMA,  June 22, 2010 9:56 AM  Follow-up for Phone Call        Normal - good news! Follow-up by: Tresa Garter MD,  June 22, 2010 5:43 PM  Additional Follow-up for Phone Call Additional follow up Details #1::        Left detailed vm on hm # Additional Follow-up by: Lamar Sprinkles, CMA,  June 22, 2010 5:46 PM

## 2010-12-14 NOTE — Assessment & Plan Note (Signed)
Summary: 6 MTH FU STC   Vital Signs:  Patient profile:   65 year old female Height:      62 inches (157.48 cm) Weight:      186 pounds (84.55 kg) BMI:     34.14 Temp:     98.3 degrees F (36.83 degrees C) oral Pulse rate:   80 / minute Pulse rhythm:   regular Resp:     16 per minute BP sitting:   110 / 70  (left arm) Cuff size:   large  Vitals Entered By: Lanier Prude, CMA(AAMA) (May 31, 2010 10:01 AM) CC: 6 mo f/u Is Patient Diabetic? No   Primary Care Provider:  Dr. Danae Orleans Kenedy Haisley  CC:  6 mo f/u.  History of Present Illness: The patient presents for a follow up of hypertension, diabetes, hyperlipidemia C/o occasional CP x months off and on vague nonexertional symptoms    Current Medications (verified): 1)  Baby Aspirin 81 Mg  Chew (Aspirin) .... Take 1 Tablet By Mouth Once A Day 2)  Loratadine 10 Mg  Tabs (Loratadine) .... Once Daily As Needed Allergies 3)  Prilosec Otc 20 Mg  Tbec (Omeprazole Magnesium) .... Take 1 Tablet By Mouth Once A Day 4)  Vitamin D3 1000 Unit  Tabs (Cholecalciferol) .... Take 1 Tablet By Mouth Once A Day 5)  Aleve 220 Mg  Tabs (Naproxen Sodium) .... Take 1 Tablet By Mouth Two Times A Day 6)  Nexium 40 Mg  Cpdr (Esomeprazole Magnesium) .Marland Kitchen.. 1 Two Times A Day 7)  Vitamin D 1000 Unit Tabs (Cholecalciferol) .... Once Daily 8)  Magnesium Aspartate 65 Mg Tabs (Magnesium) .... Once Daily 9)  Carvedilol 6.25 Mg Tabs (Carvedilol) .Marland Kitchen.. 1 By Mouth Bid 10)  Stool Softner .... Take 1 Tablet By Mouth Once A Day 11)  Vitamin B-12 Cr 1000 Mcg  Tbcr (Cyanocobalamin) .... Take One Half Tablet By Mouth Daily 12)  Spironolactone 25 Mg Tabs (Spironolactone) .Marland Kitchen.. 1 By Mouth Qam 13)  Cymbalta 60 Mg Cpep (Duloxetine Hcl) .... Take One Capsule Daily 14)  Zolpidem Tartrate 10 Mg  Tabs (Zolpidem Tartrate) .... 1/2 or 1 By Mouth At [facility] Prn 15)  Flonase 50 Mcg/act Susp (Fluticasone Propionate) .Marland Kitchen.. 1 Spr Each Nostr Qd As Needed 16)  Miralax  Powd (Polyethylene Glycol  3350) .Marland Kitchen.. 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   Family History: F bladder Ca Breast Ca in aunts Family History Hypertension Family History of Diabetes: Maternal Grandmother, Maternal Grandfather Family History of Heart Disease: Mother MI at 41  Review of Systems  The patient denies chest pain, syncope, dyspnea on exertion, and prolonged cough.    Physical Exam  General:  Well developed, , no acute distress, overweight-appearing.   Head:  Normocephalic and atraumatic without obvious abnormalities. No apparent alopecia or balding. Nose:  No deformity, discharge,  or lesions. Mouth:  Oral mucosa and oropharynx without lesions  or exudates.  Teeth in good repair. Lungs:  Normal respiratory effort, chest expands symmetrically. Lungs are clear to auscultation, no crackles or wheezes. Heart:  Normal rate and regular rhythm. S1 and S2 normal without gallop, murmur, click, rub or other extra sounds. Abdomen:  Bowel sounds positive,abdomen soft and non-tender without masses, organomegaly or hernias noted. Msk:  No deformity or scoliosis noted of thoracic or lumbar spine.   Extremities:  No clubbing, cyanosis, edema, or deformity noted with normal full range of motion of all joints.   Neurologic:  WNL Skin:   Intact without suspicious lesions or rashes Psych:  Alert and cooperative. Normal mood and affect.   Impression & Recommendations:  Problem # 1:  VITAMIN B12 DEFICIENCY (ICD-266.2) Assessment Improved On Rx  Problem # 2:  HYPERTENSION (ICD-401.9) Assessment: Improved  Her updated medication list for this problem includes:    Carvedilol 6.25 Mg Tabs (Carvedilol) .Marland Kitchen... 1 by mouth bid    Spironolactone 25 Mg Tabs (Spironolactone) .Marland Kitchen... 1 by mouth qam  BP today: 110/70 Prior BP: 132/90 (11/30/2009)  Labs Reviewed: K+: 5.0 (05/25/2010) Creat: : 0.7 (05/25/2010)   Chol: 184 (11/23/2009)   HDL: 46.70 (11/23/2009)   LDL: 118 (11/23/2009)   TG: 97.0 (11/23/2009)  Problem # 3:  HYPERGLYCEMIA (ICD-790.29) Assessment: Unchanged The labs were reviewed with the patient.   Problem # 4:  TOBACCO USE DISORDER/SMOKER-SMOKING CESSATION DISCUSSED (ICD-305.1) Assessment: Unchanged  Encouraged smoking cessation and discussed different methods for smoking cessation.   Problem # 5:  CHEST PAIN (ICD-786.50) atypical Assessment: New  Orders: Cardiolite (Cardiolite)  Complete Medication List: 1)  Baby Aspirin 81 Mg Chew (Aspirin) .... Take 1 tablet by mouth once a day 2)  Loratadine 10 Mg Tabs (Loratadine) .... Once daily as needed allergies 3)  Prilosec Otc 20 Mg Tbec (Omeprazole magnesium) .... Take 1 tablet by mouth once a day 4)  Vitamin D3 1000 Unit Tabs (Cholecalciferol) .... Take 1 tablet by mouth once a day 5)  Aleve 220 Mg Tabs (Naproxen sodium) .... Take 1 tablet by mouth two times a day 6)  Nexium 40 Mg Cpdr (Esomeprazole magnesium) .Marland Kitchen.. 1 two times a day 7)  Vitamin D 1000 Unit Tabs (Cholecalciferol) .... Once daily 8)  Magnesium Aspartate 65 Mg Tabs (Magnesium) .... Once daily 9)  Carvedilol 6.25 Mg Tabs (Carvedilol) .Marland Kitchen.. 1 by mouth bid 10)  Stool Softner  .... Take 1 tablet by mouth once a day 11)  Vitamin B-12 Cr 1000 Mcg Tbcr (Cyanocobalamin) .... Take one half tablet by mouth  daily 12)  Spironolactone 25 Mg Tabs (Spironolactone) .Marland Kitchen.. 1 by mouth qam 13)  Cymbalta 60 Mg Cpep (Duloxetine hcl) .... Take one capsule daily 14)  Zolpidem Tartrate 10 Mg Tabs (Zolpidem tartrate) .... 1/2 or 1 by mouth at hs prn 15)  Flonase 50 Mcg/act Susp (Fluticasone propionate) .Marland Kitchen.. 1 spr each nostr qd as needed 16)  Miralax Powd (Polyethylene glycol 3350) .Marland Kitchen.. 1 once daily  Patient Instructions: 1)  Start exercising 2)  Please schedule a follow-up appointment in 6 months well w/labs and A1c v70.0 790.29  Vit B12 266.20. Prescriptions: FLONASE 50 MCG/ACT SUSP (FLUTICASONE PROPIONATE) 1 spr each nostr qd as needed  #3 x 3   Entered and Authorized by:   Tresa Garter MD   Signed by:   Tresa Garter MD on 05/31/2010   Method used:   Electronically to        [facility]* (retail)  [address]       [address] Shipping/mailing       [place], [place]  [address]       Ph: [phone]       Fax: [phone]   RxID:   [id number]

## 2010-12-14 NOTE — Assessment & Plan Note (Signed)
Summary: Cardiology Nuclear Study  Nuclear Med Background Indications for Stress Test: Evaluation for Ischemia     Symptoms: Chest Pain    Nuclear Pre-Procedure Cardiac Risk Factors: Family History - CAD, Hypertension, Lipids, Smoker Caffeine/Decaff Intake: None NPO After: 7:30 PM Lungs: clear IV 0.9% NS with Angio Cath: 22g     IV Site: (R) AC IV Started by: Irean Hong RN Chest Size (in) 40     Cup Size D     Height (in): 62 Weight (lb): 184 BMI: 33.78 Tech Comments: Coreg held 24 hrs.  Nuclear Med Study 1 or 2 day study:  1 day     Stress Test Type:  Stress Reading MD:  Olga Millers, MD     Referring MD:  A.Plotnikov Resting Radionuclide:  Technetium 56m Tetrofosmin     Resting Radionuclide Dose:  10.6 mCi  Stress Radionuclide:  Technetium 31m Tetrofosmin     Stress Radionuclide Dose:  33 mCi   Stress Protocol Exercise Time (min):  7:00 min     Max HR:  164 bpm     Predicted Max HR:  158 bpm  Max Systolic BP: 210 mm Hg     Percent Max HR:  103.80 %     METS: 8.50 Rate Pressure Product:  [address]    Stress Test Technologist:  Milana Na EMT-P     Nuclear Technologist:  Domenic Polite CNMT  Rest Procedure  Myocardial perfusion imaging was performed at rest 45 minutes following the intravenous administration of Myoview Technetium 75m Tetrofosmin.  Stress Procedure  The patient exercised for 7:00. The patient stopped due to fatigue and denied any chest pain.  There were non specific ST-T wave changes.  Myoview was injected at peak exercise and myocardial perfusion imaging was performed after a brief delay.  QPS Raw Data Images:  Acuisition technically good; normal left ventricular size. Stress Images:  There is normal uptake in all areas. Rest Images:  Normal homogeneous uptake in all areas of the myocardium. Subtraction (SDS):  No evidence of ischemia. Transient Ischemic Dilatation:  .88  (Normal <1.22)  Lung/Heart Ratio:  .3  (Normal <0.45)  Quantitative  Gated Spect Images QGS EDV:  63 ml QGS ESV:  15 ml QGS EF:  77 % QGS cine images:  Normal wall motion.   Overall Impression  Exercise Capacity: Fair exercise capacity. BP Response: Hypertensive blood pressure response. Clinical Symptoms: No chest pain ECG Impression: No significant ST segment change suggestive of ischemia. Overall Impression: Normal stress nuclear study with no infarct or ischemia.

## 2010-12-14 NOTE — Letter (Signed)
Summary: Outpatient Coinsurance Notice  Outpatient Coinsurance Notice   Imported By: Marylou Mccoy 06/23/2010 18:41:17  _____________________________________________________________________  External Attachment:    Type:   Image     Comment:   External Document

## 2010-12-14 NOTE — Progress Notes (Signed)
Summary: Nuclear Pre-Procedure  Phone Note Outgoing Call Call back at [facility] Phone [phone]   Call placed by: Stanton Kidney, EMT-P,  June 01, 2010 1:39 PM Action Taken: Phone Call Completed Summary of Call: Left message with information on Myoview Information Sheet (see scanned document for details).     Nuclear Med Background Indications for Stress Test: Evaluation for Ischemia     Symptoms: Chest Pain    Nuclear Pre-Procedure Cardiac Risk Factors: Family History - CAD, Hypertension, Lipids, Smoker Height (in): 62

## 2010-12-14 NOTE — Miscellaneous (Signed)
Summary: Appointment Canceled  Appointment status changed to canceled by [employer] on 06/02/2010 9:54 AM.  Cancellation Comments --------------------- crs/dx:atyp CP, smoker, elev CBG/wt:186/ins:[facility]/Dr plotnikov  Appointment Information ----------------------- Appt Type:  CARDIOLOGY NUCLEAR TESTING      Date:  Wednesday, June 02, 2010      Time:  8:00 AM for 15 min   Urgency:  Routine   Made By:  Hoy Finlay Scheduler  To Visit:  [id number]    Reason:  crs/dx:atyp CP, smoker, elev CBG/wt:186/ins:[facility]/Dr plotnikov  Appt Comments ------------- -- 06/02/10 9:54: ([facility]) CANCELED -- crs/dx:atyp CP, smoker, elev CBG/wt:186/ins:[facility]/Dr plotnikov -- 05/31/10 11:30: ([facility]) BOOKED -- Routine CARDIOLOGY NUCLEAR TESTING at 06/02/2010 8:00 AM for 15 min crs/dx:atyp CP, smoker, elev CBG/wt:186/ins:[facility]/Dr

## 2010-12-14 NOTE — Consult Note (Signed)
Summary: Gastroenterology/[facility]  Gastroenterology/[facility]   Imported By: Sherian Rein 12/11/2009 13:41:29  _____________________________________________________________________  External Attachment:    Type:   Image     Comment:   External Document

## 2010-12-14 NOTE — Letter (Signed)
Summary: GI/[facility]  GI/[facility]   Imported By: Sherian Rein 12/28/2009 08:36:25  _____________________________________________________________________  External Attachment:    Type:   Image     Comment:   External Document

## 2010-12-14 NOTE — Progress Notes (Signed)
  Phone Note Refill Request Message from:  Fax from Pharmacy on Mar 26, 2010 3:04 PM  Refills Requested: Medication #1:  ZOLPIDEM TARTRATE 10 MG  TABS 1/2 or 1 by mouth at hs prn called [facility] [address] and told them to complt. cancel pt's zolpidem rx     Prescriptions: ZOLPIDEM TARTRATE 10 MG  TABS (ZOLPIDEM TARTRATE) 1/2 or 1 by mouth at hs prn  #30 x 3   Entered by:   Ami Bullins CMA   Authorized by:   Tresa Garter MD   Signed by:   Bill Salinas CMA on 03/26/2010   Method used:   Telephoned to ...       [facility]* (retail)       [address]       [address] Shipping/mailing       [place], [place]  [address]       Ph: [phone]       Fax: [phone]   RxID:   [id number]

## 2010-12-14 NOTE — Progress Notes (Signed)
Summary: Nuclear Pre-Procedure  Phone Note Outgoing Call Call back at [facility] Phone [phone]   Call placed by: Stanton Kidney, EMT-P,  June 09, 2010 1:45 PM Action Taken: Phone Call Completed Summary of Call: Left message with information on Myoview Information Sheet (see scanned document for details).     Nuclear Med Background Indications for Stress Test: Evaluation for Ischemia     Symptoms: Chest Pain    Nuclear Pre-Procedure Cardiac Risk Factors: Family History - CAD, Hypertension, Lipids, Smoker Height (in): 62

## 2010-12-16 NOTE — Letter (Addendum)
Summary: Primary Care Consult Scheduled Letter  [facility]  [address] [place], [place] [address]   Phone: [phone]  Fax: [phone]      12/10/2010 MRN: [id number]  MARSHALL ROEHRICH [address] [place], [place]  [address]    Dear Ms. Mayford Knife,      We have scheduled an appointment for you.  At the recommendation of Dr.Plotnikov, we have scheduled you a consult with [facility]) on Feb, 10,2012 at 11:30 am . Their address is [address] [place] The office phone number is [phone].If this appointment day and time is not convenient for you, please feel free to call the office of the doctor you are being referred to at the number listed above and reschedule the appointment.     It is important for you to keep your scheduled appointments. We are here to make sure you are given good patient care. If you have questions or you have made changes to your appointment, please notify us at  [phone]      , ask for  DEBRA            .    Thank you,  Patient Care Coordinator [facility]

## 2010-12-16 NOTE — Assessment & Plan Note (Signed)
Summary: 6 MTH PHYSICAL--STC   Vital Signs:  Patient profile:   65 year old female Height:      62 inches Weight:      183 pounds BMI:     33.59 Temp:     98.6 degrees F oral Pulse rate:   88 / minute Pulse rhythm:   regular Resp:     16 per minute BP sitting:   160 / 90  (left arm) Cuff size:   large  Vitals Entered By: Lanier Prude, CMA(AAMA) (December 06, 2010 8:34 AM) CC: CPX Is Patient Diabetic? No Comments pt is not taking Loratadine, Prilosec, Aleve or the stool softner   Primary Care Provider:  Dr. Danae Orleans Plotnikov  CC:  CPX.  History of Present Illness: The patient presents for a preventive health examination  C/o L earache x 2 wks  Preventive Screening-Counseling & Management  Caffeine-Diet-Exercise     Does Patient Exercise: no  Current Medications (verified): 1)  Baby Aspirin 81 Mg  Chew (Aspirin) .... Take 1 Tablet By Mouth Once A Day 2)  Loratadine 10 Mg  Tabs (Loratadine) .... Once Daily As Needed Allergies 3)  Prilosec Otc 20 Mg  Tbec (Omeprazole Magnesium) .... Take 1 Tablet By Mouth Once A Day 4)  Vitamin D3 1000 Unit  Tabs (Cholecalciferol) .... Take 1 Tablet By Mouth Once A Day 5)  Aleve 220 Mg  Tabs (Naproxen Sodium) .... Take 1 Tablet By Mouth Two Times A Day 6)  Nexium 40 Mg  Cpdr (Esomeprazole Magnesium) .Marland Kitchen.. 1 Two Times A Day 7)  Vitamin D 1000 Unit Tabs (Cholecalciferol) .... Once Daily 8)  Magnesium Aspartate 65 Mg Tabs (Magnesium) .... Once Daily 9)  Carvedilol 6.25 Mg Tabs (Carvedilol) .Marland Kitchen.. 1 By Mouth Bid 10)  Stool Softner .... Take 1 Tablet By Mouth Once A Day 11)  Vitamin B-12 Cr 1000 Mcg  Tbcr (Cyanocobalamin) .... Take One Half Tablet By Mouth Daily 12)  Spironolactone 25 Mg Tabs (Spironolactone) .Marland Kitchen.. 1 By Mouth Qam 13)  Cymbalta 60 Mg Cpep (Duloxetine Hcl) .... Take One Capsule Daily 14)  Zolpidem Tartrate 10 Mg  Tabs (Zolpidem Tartrate) .... 1/2 or 1 By Mouth At [facility] Prn 15)  Flonase 50 Mcg/act Susp (Fluticasone Propionate) .Marland Kitchen.. 1  Spr Each Nostr Qd As Needed 16)  Miralax  Powd (Polyethylene Glycol 3350) .Marland Kitchen.. 1 Once Daily 17)  Zyrtec Allergy 10 Mg Tabs (Cetirizine Hcl) .... 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  Regular exercise-no  Review of Systems  The patient denies anorexia, fever, weight loss, weight gain, vision loss, decreased hearing, hoarseness, chest pain, syncope, dyspnea on exertion, peripheral edema, prolonged cough, headaches, hemoptysis, abdominal pain, melena, hematochezia, severe indigestion/heartburn, hematuria, incontinence, genital sores, muscle weakness, suspicious skin lesions, transient blindness, difficulty walking, depression, unusual  weight change, abnormal bleeding, enlarged lymph nodes, angioedema, and breast masses.         knee pain  Physical Exam  General:  Well developed, , no acute distress, overweight-appearing.   Head:  Normocephalic and atraumatic without obvious abnormalities. No apparent alopecia or balding. Eyes:  No corneal or conjunctival inflammation noted. EOMI. Perrla.  Ears:  wax R small L w/a little fluitd behind TM Nose:  No deformity, discharge,  or lesions. Mouth:  Oral mucosa and oropharynx without lesions  or exudates.  Teeth in good repair. Neck:  Supple; no masses or thyromegaly. Lungs:  Normal respiratory effort, chest expands symmetrically. Lungs are clear to auscultation, no crackles or wheezes. Heart:  Normal rate and regular rhythm. S1 and S2 normal without gallop, murmur, click, rub or other extra sounds. Abdomen:  Bowel sounds positive,abdomen soft and non-tender without masses, organomegaly or hernias noted. Msk:  No deformity or scoliosis noted of thoracic or lumbar spine.   Extremities:  No clubbing, cyanosis, edema, or deformity noted with normal full range of motion of all joints.   Neurologic:  WNL Skin:  Intact without suspicious lesions or rashes Psych:  Alert and cooperative. Normal mood and affect.   Impression & Recommendations:  Problem # 1:  HEALTH MAINTENANCE EXAM (ICD-V70.0) Assessment New Health and age related issues were discussed. Available screening tests and vaccinations were discussed as well. Healthy life style including good diet and exercise was discussed.  She declined shots The labs were reviewed with the patient.  GYN q 12 months   Orders: T-2 View CXR, Same Day (71020.5TC)  Problem # 2:  HYPERGLYCEMIA (ICD-790.29) Assessment: Unchanged  Labs Reviewed: Creat: 0.5 (12/02/2010)     Problem # 3:  VITAMIN B12 DEFICIENCY (ICD-266.2) Assessment: Improved On the regimen of medicine(s) reflected in the chart    Problem # 4:  FIBROMYALGIA (ICD-729.1) Assessment: Unchanged  Her updated medication list for this problem includes:    Baby Aspirin 81 Mg Chew (Aspirin) .Marland Kitchen... Take 1 tablet by mouth once a day    Aleve 220 Mg Tabs (Naproxen sodium) .Marland Kitchen... Take 1 tablet by mouth two times a day  Problem # 5:  TOBACCO USE DISORDER/SMOKER-SMOKING CESSATION DISCUSSED (ICD-305.1) Assessment: Unchanged  Encouraged smoking cessation and discussed different methods for smoking cessation.   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 2 caps by mouth bid  Complete Medication List: 1)  Baby Aspirin 81 Mg Chew (Aspirin) .... Take 1 tablet by mouth once a day 2)  Loratadine 10 Mg Tabs (Loratadine) .... Once daily as needed allergies 3)  Prilosec Otc 20 Mg Tbec (Omeprazole magnesium) .... Take 1 tablet by mouth once a day 4)  Vitamin D3 1000 Unit Tabs (Cholecalciferol) .... Take 1 tablet by mouth once a day 5)  Aleve 220 Mg Tabs (Naproxen sodium) .... Take 1 tablet by mouth two times a day 6)  Nexium 40 Mg Cpdr (Esomeprazole magnesium) .Marland Kitchen.. 1 two times a day 7)  Vitamin D 1000 Unit Tabs (Cholecalciferol) .... Once daily 8)  Magnesium Aspartate 65 Mg Tabs (Magnesium) .Marland KitchenMarland KitchenMarland Kitchen  Once daily 9)  Stool Softner  .... Take 1 tablet by mouth once a day 10)  Vitamin B-12 Cr 1000 Mcg Tbcr (Cyanocobalamin) .... Take one half tablet by mouth daily 11)  Spironolactone 25 Mg Tabs (Spironolactone) .Marland Kitchen.. 1 by mouth qam 12)  Cymbalta 60 Mg Cpep (Duloxetine hcl) .... Take one capsule daily 13)  Zolpidem Tartrate 10 Mg Tabs (Zolpidem tartrate) .... 1/2 or 1 by mouth at hs prn 14)  Flonase 50 Mcg/act Susp (Fluticasone propionate) .Marland Kitchen.. 1 spr each nostr qd as needed 15)  Miralax Powd (Polyethylene glycol 3350) .Marland Kitchen.. 1 once daily 16)  Zyrtec Allergy 10 Mg Tabs (Cetirizine hcl) .... As needed 17)  Coreg 12.5 Mg Tabs (Carvedilol) .Marland Kitchen.. 1  by mouth bid 18)  Amoxicillin 500 Mg Caps (Amoxicillin) .... 2 caps by mouth bid  Patient Instructions: 1)  Please schedule a follow-up appointment in 6 months. 2)  BMP prior to visit, ICD-9: 3)  Lipid Panel prior to visit, ICD-9: 4)   HbgA1C prior to visit, ICD-9:790.29  Prescriptions: AMOXICILLIN 500 MG CAPS (AMOXICILLIN) 2 caps by mouth bid  #40 x 0   Entered and Authorized by:   Tresa Garter MD   Signed by:   Tresa Garter MD on 12/06/2010   Method used:   Electronically to        [facility]* (retail)       [address]       [address] Shipping/mailing       [place], [place]  [address]       Ph: [phone]       Fax: [phone]   RxID:   [id number] COREG 12.5 MG TABS (CARVEDILOL) 1  by mouth bid  #180 x 3   Entered and Authorized by:   Tresa Garter MD   Signed by:   Tresa Garter MD on 12/06/2010   Method used:   Electronically to        [facility]* (retail)       [address]       [address] Shipping/mailing       [place], [place]  [address]       Ph: [phone]       Fax: [phone]   RxID:   [id number]    Orders Added: 1)  T-2 View CXR, Same Day [71020.5TC] 2)  Doppler Referral [Doppler] 3)  Est. Patient age 43-64 [95]   Immunization History:  Influenza Immunization History:    Influenza:  historical (09/30/2010)   Contraindications/Deferment of Procedures/Staging:    Test/Procedure: Pneumovax vaccine    Reason for deferment: patient declined     Test/Procedure: DPT vaccine    Reason for deferment: declined   Immunization History:  Influenza Immunization History:    Influenza:  Historical (09/30/2010)

## 2011-01-05 ENCOUNTER — Telehealth: Payer: Self-pay | Admitting: Internal Medicine

## 2011-01-07 ENCOUNTER — Encounter: Payer: Self-pay | Admitting: Internal Medicine

## 2011-01-07 ENCOUNTER — Encounter (INDEPENDENT_AMBULATORY_CARE_PROVIDER_SITE_OTHER): Payer: Commercial Managed Care - PPO

## 2011-01-07 DIAGNOSIS — R42 Dizziness and giddiness: Secondary | ICD-10-CM

## 2011-01-11 NOTE — Miscellaneous (Signed)
Summary: Orders Update  Clinical Lists Changes  Orders: Added new Test order of Carotid Duplex (Carotid Duplex) - Signed 

## 2011-01-11 NOTE — Progress Notes (Signed)
  Phone Note Call from Patient Call back at Home Phone [phone]   Caller: Patient--[phone] Call For: Dr Posey Rea Summary of Call: Pt states dizziness & nausea, could this be due to bp meds she is currently taking? Initial call taken by: Verdell Face,  January 05, 2011 10:57 AM  Follow-up for Phone Call        I doubt. It could be due to magnesium, stool softner, virus etc If suspecting BP med - ok to hold x 1 wk and see Follow-up by: Tresa Garter MD,  January 05, 2011 1:30 PM  Additional Follow-up for Phone Call Additional follow up Details #1::        Pt informed  Additional Follow-up by: Lamar Sprinkles, CMA,  January 05, 2011 2:28 PM

## 2011-01-17 ENCOUNTER — Telehealth: Payer: Self-pay | Admitting: Internal Medicine

## 2011-01-18 ENCOUNTER — Inpatient Hospital Stay (INDEPENDENT_AMBULATORY_CARE_PROVIDER_SITE_OTHER)
Admission: RE | Admit: 2011-01-18 | Discharge: 2011-01-18 | Disposition: A | Discharge status: Home / Self Care | Payer: Commercial Managed Care - PPO | Source: Ambulatory Visit | Attending: Emergency Medicine | Admitting: Emergency Medicine

## 2011-01-18 DIAGNOSIS — J019 Acute sinusitis, unspecified: Secondary | ICD-10-CM

## 2011-01-18 DIAGNOSIS — J069 Acute upper respiratory infection, unspecified: Secondary | ICD-10-CM

## 2011-01-20 NOTE — Letter (Signed)
Summary: [facility]  [facility]   Imported By: Sherian Rein 01/14/2011 14:01:07  _____________________________________________________________________  External Attachment:    Type:   Image     Comment:   External Document

## 2011-01-24 ENCOUNTER — Telehealth: Payer: Self-pay | Admitting: Internal Medicine

## 2011-01-25 NOTE — Progress Notes (Signed)
Summary: Rf Ambien-Plot pt  Phone Note Refill Request Message from:  Pharmacy  Refills Requested: Medication #1:  ZOLPIDEM TARTRATE 10 MG  TABS 1/2 or 1 by mouth at hs prn   Dosage confirmed as above?Dosage Confirmed   Supply Requested: 30   Last Refilled: 12/21/2010  Method Requested: Telephone to Pharmacy Initial call taken by: Lanier Prude, [facility]),  January 17, 2011 1:03 PM  Follow-up for Phone Call        ok - #30, no refills Follow-up by: Newt Lukes MD,  January 17, 2011 2:18 PM  Additional Follow-up for Phone Call Additional follow up Details #1::        Rx called to pharmacy Additional Follow-up by: Lanier Prude, [facility]),  January 17, 2011 4:34 PM    Prescriptions: ZOLPIDEM TARTRATE 10 MG  TABS (ZOLPIDEM TARTRATE) 1/2 or 1 by mouth at hs prn  #30 x 0   Entered by:   Lanier Prude, CMA(AAMA)   Authorized by:   Tresa Garter MD   Signed by:   Lanier Prude, CMA(AAMA) on 01/17/2011   Method used:   Telephoned to ...       [facility]* (retail)       [address]       [address] Shipping/mailing       [place], [place]  [address]       Ph: [phone]       Fax: [phone]   RxID:   [id number]

## 2011-02-01 NOTE — Progress Notes (Signed)
Summary: REQ FOR ALT RX  Phone Note Call from Patient Call back at Work Phone [phone] Call back at [phone]   Reason for Call: Talk to Doctor Summary of Call: Pt c/o "sinus infection" and has been on ammoxicllin but feels this is not stong enough. Pt is due to fly out Saturday - worried b/c of this infection.  Initial call taken by: Lamar Sprinkles, CMA,  January 24, 2011 12:38 PM  Follow-up for Phone Call        OV w/any MD Who prescr Amoxicillin? Follow-up by: Tresa Garter MD,  January 24, 2011 6:09 PM  Additional Follow-up for Phone Call Additional follow up Details #1::        Spoke w/pt- feels better today, will call back for office visit if worse Additional Follow-up by: Lamar Sprinkles, CMA,  January 25, 2011 9:42 AM

## 2011-02-07 ENCOUNTER — Telehealth: Payer: Self-pay | Admitting: *Deleted

## 2011-02-07 MED ORDER — SPIRONOLACTONE 25 MG PO TABS: 25.0000 mg | ORAL_TABLET | Freq: Every day | ORAL | Status: DC

## 2011-02-07 NOTE — Telephone Encounter (Signed)
rf spironolactone

## 2011-02-24 ENCOUNTER — Telehealth: Payer: Self-pay | Admitting: *Deleted

## 2011-02-24 MED ORDER — ZOLPIDEM TARTRATE 10 MG PO TABS: 5.0000 mg | ORAL_TABLET | Freq: Every evening | ORAL | Status: DC | PRN

## 2011-02-24 NOTE — Telephone Encounter (Signed)
OK to fill this prescription with additional refills x2 Thank you!  

## 2011-02-24 NOTE — Telephone Encounter (Signed)
rec rf req for Zolpidem 10mg  1/2-1 po qhs # 30... Last fill 01-17-11

## 2011-04-01 NOTE — Op Note (Signed)
NAME:  Jessica Khan, Jessica Khan                ACCOUNT NO.:  [id number]   MEDICAL RECORD NO.:  [id number]          PATIENT TYPE:  AMB   LOCATION:  ENDO                         FACILITY:  [facility]   PHYSICIAN:  Anselmo Rod, M.D.  DATE OF BIRTH:  [birth year]   DATE OF PROCEDURE:  05/24/2006  DATE OF DISCHARGE:                                 OPERATIVE REPORT   OPERATIVE REPORT:   PROCEDURE PERFORMED:  Screening colonoscopy.   ENDOSCOPIST:  Anselmo Rod MD.   INSTRUMENTS USED:  Olympus video colonoscope.   INDICATION FOR PROCEDURE:  This 65 year old white female with a history of  rectal bleeding undergoing a colonoscopy to rule out colonic polyps.  The  patient has a history of internal hemorrhoids diagnosed in the past.   PREPROCEDURE PREPARATION:  Informed consent was retrieved from the patient,  and the patient fasted for 4 hours prior to procedure and prepped with  OsmoPrep overnight and on the morning of the procedure.  Risks and benefits  of the procedure, including a 10% missed rate of cancer or polyp were  discussed with the patient, as well.   BRIEF HISTORY AND PHYSICAL:  VITAL SIGNS:  The patient has stable vital  signs.  NECK:  Supple.  CHEST:  Clear to auscultation.  S1, S2 regular.  ABDOMEN:  Soft with normal bowel sounds.   DESCRIPTION OF PROCEDURE:  The patient was placed in the left lateral  decubitus position, sedated with 125 mcg of fentanyl and 12 mcg of Versed in  slow, incremental doses.  Once the patient was adequately sedated and  maintained on low oxygen, continuous cardiac monitoring, Olympus video  colonoscope was advanced from the rectum to the cecum.  A few scattered  diverticula were seen, especially in the left colon.  Prominent, inflamed  internal hemorrhoids were seen in retroflexion.  The rest of the exam of the  cecum and terminal ileum was unremarkable.  The appendiceal orifice and the  terminal ileum appear healthy and without lesions. The patient  tolerated the  procedure well without immediate complications.   IMPRESSION:  1.Prominent inflamed internal hemorrhoid.  2.Few scattered diverticula.  3.Otherwise normal colonoscopy of the terminal ileum.   RECOMMENDATIONS:  1.Check a CBC today.  2.Anusol-HC 2.5% suppositories will be called into the patient's pharmacy  [facility]).  3.Outpatient followup as need arises in the future.  4.Repeat colonoscopy in the next 10 years unless the patient develops any  abnormal symptoms in the interim. 5.Outpatient follow as needed in the  future.      Anselmo Rod, M.D.  Electronically Signed     JNM/MEDQ  D:  05/24/2006  T:  05/25/2006  Job:  [id number]   cc:   Georgina Quint. Plotnikov, M.D. [facility]  [address]  [place]  [place] [address]

## 2011-04-01 NOTE — Op Note (Signed)
NAME:  Jessica Khan, Jessica Khan                ACCOUNT NO.:  [id number]   MEDICAL RECORD NO.:  [id number]          PATIENT TYPE:  AMB   LOCATION:  ENDO                         FACILITY:  [facility]   PHYSICIAN:  Anselmo Rod, M.D.  DATE OF BIRTH:  1946-05-23   DATE OF PROCEDURE:  07/21/2006  DATE OF DISCHARGE:                                 OPERATIVE REPORT   PROCEDURE PERFORMED:  Esophagogastroduodenoscopy with small bowel and  gastric biopsies.   ENDOSCOPIST:  Anselmo Rod, M.D.   INSTRUMENT USED:  Olympus videoscopic pan endoscope.   INDICATIONS FOR PROCEDURE:  A 65 year old white female with a history of  nausea and abdominal discomfort undergoing an EGD to rule out peptic ulcer  disease, esophagitis, gastritis, etc.   PRE-PROCEDURE PREPARATION:  Informed consent was procured from the patient.  The patient fasted for four hours prior to the procedure.  Risks and  benefits of the procedure were discussed with her in great detail.   PRE-PROCEDURE PHYSICAL EXAMINATION:  VITAL SIGNS:  Stable vital signs.  NECK:  Supple.  CHEST:  Clear to auscultation.  HEART:  S1 and S2.  Regular.  ABDOMEN:  Soft with normal bowel sounds.   DESCRIPTION OF PROCEDURE:  The patient was placed in the left lateral  decubitus position and sedated with 75 mcg of fentanyl and 7.5 mg of Versed  in slow, incremental doses.  Once the patient was adequately sedated and  maintained on low flow oxygen with continuous cardiac monitoring, the  Olympus videoscopic pan endoscope was advanced with the mouthpiece over the  tongue into the esophagus.  Under direct vision, the entire esophagus  appeared widely patent with no evidence of ring, stricture, masses,  esophagitis, or Barrett's mucosa.  The scope was then advanced to the  stomach.  Multiple small gastric polyps were seen in the proximal stomach.  Some of these were biopsied for pathology.  The rest of the gastric mucosa  appeared healthy, and no ulcers, lesions,  masses, or polyps were seen.  The  proximal small bowel appeared normal.  Small bowel biopsies were done to  rule out sprue.  Retroflexion in the high cardia revealed no more masses  except for the gastric polyps mentioned above.  The patient tolerated the  procedure well without complications.   IMPRESSION:  1.Normal-appearing esophagus with no evidence of esophagitis or  Barrett's mucosa.  No strictures or masses were seen.  2.Multiple small gastric polyps in the proximal stomach.  Some of these were  biopsied for pathology.  3.No ulcers or masses seen.  4.Normal proximal small bowel, biopsies done to rule out sprue.   RECOMMENDATIONS:  1.Await pathology results.  2.Avoid nonsteroidals and aspirin for now.  3.Proceed with antibiotics if H. pylori is present on the biopsies.  4.Outpatient followup in the next two weeks or earlier if need be.  5.Zofran will be called into the patient's pharmacy[[facility]]. If  this is not allowed by [employer], she will be put on Phenergan  12.5 mg 8-12 hours p.r.n. nausea.  Side effects like drowsiness have been  discussed within  great detail if she were to use the Phenergan.      Anselmo Rod, M.D.  Electronically Signed     JNM/MEDQ  D:  07/21/2006  T:  07/21/2006  Job:  [id number]   cc:   Georgina Quint. Plotnikov, MD

## 2011-04-01 NOTE — Op Note (Signed)
NAME:  Jessica Khan, Jessica Khan                          ACCOUNT NO.:  [id number]   MEDICAL RECORD NO.:  [id number]                   PATIENT TYPE:  OIB   LOCATION:  2550                                 FACILITY:  [facility]   PHYSICIAN:  Sandria Bales. Ezzard Standing, M.D.               DATE OF BIRTH:  [birth year]   DATE OF PROCEDURE:  12/04/2002  DATE OF DISCHARGE:                                 OPERATIVE REPORT   CCS NUMBER:  [id number]   PREOPERATIVE DIAGNOSIS:  Cholelithiasis and chronic cholecystitis.   POSTOPERATIVE DIAGNOSIS:  Cholelithiasis and chronic cholecystitis.   OPERATION:  Laparoscopic cholecystectomy with intraoperative cholangiogram.   SURGEON:  Sandria Bales. Ezzard Standing, M.D.   FIRST ASSISTANT:  Lebron Conners, M.D.   ANESTHESIA:  General endotracheal supervised by Maren Beach, M.D.   ESTIMATED BLOOD LOSS:  Minimal   INDICATIONS FOR PROCEDURE:  The patient is a 65 year old, white female who  is a patient of Dr. Dimas Alexandria who has had some vague abdominal  complaints with nausea and associated with food.  An ultrasound has revealed  cholelithiasis and she now comes for attempted laparoscopic cholecystectomy.   The indications and potential complications have been discussed with the  patient.  Potential complications included but not limited to infection,  bleeding, common bile duct injury and open surgery.   DESCRIPTION OF PROCEDURE:  The patient was taken to the operating room on  the day of admission where she underwent a general anesthetic supervised by  Dr. Laverle Hobby.  She had 1 gram Ancef at the initiation of the procedure.  Her abdomen was prepped with Betadine solution and sterilely draped.  She  had already had a prior laparoscopic procedure through an infraumbilical  incision.  I went through the same incision and entered to the abdominal  cavity.  I placed a 0 degree laparoscope through a 12 mm Hasson trocar.  The  Hasson trocar was secured with a 0 Vicryl suture.   Exploration of the right  and left lobe of the liver were unremarkable.  Anterior wall of the stomach  was unremarkable.  The omentum covered most of the bowel.  She did have some  adhesions along her right abdominal wall about mid abdomen.  These appeared  benign, old and nothing much to worry about these.   A 10 mm Ethicon trocar in the subxiphoid location and a 5 mm Ethicon trocar  in the right mid costal and 5 mm Ethicon trocar in the right lateral  subcostal location.  The gallbladder was grasped, rotated cephalad.  A  dissection carried out.  An anterior branch of the cystic artery was triply  clipped and divided.  The cystic duct itself was isolated.  A clip placed on  the gallbladder side and intraoperative cholangiogram obtained.   The intraoperative cholangiogram was obtained using cut off taut catheter  inserted through a 14 gauge Jelco catheter.  The  Jelco catheter was secured  with an Endoclip and I then used about 6 or 7 cc of half strength Hypaque  solution and injected this through the catheter.   This was visualized under direct visualization with a fluoroscopy and this  showed free flow of contrast down the cystic duct into the common bile duct  into the duodenum and reflex of the hepatic radicals.  There was no filling  defect or abnormality.  This was felt to be a normal intraoperative  cholangiogram.  The taut catheter was then removed.  The cystic duct was  triply Endoclip and divided.   Gallbladder was removed from the gallbladder bed using primary hook Bovie  coagulation.  I revisualized gallbladder bed and the triangle of  Calot and  saw no bleeding or bile leak.  The gallbladder was then delivered through  the umbilical wound in an EndoCatch bag and sent to pathology.  The  umbilical port was closed with 0 Vicryl suture.  I infiltrated each port  site with 0.25% Marcaine using about 12 to 13 cc of Marcaine total.  Each  skin wound was closed with a 5-0 Vicryl  suture, painted with tincture of  Benzoin Steri-Strips and sterilely dressed.   The patient tolerated the procedure well.  She was transported to the  recovery room in good condition.  Sponge and needle count were correct at  the end of the end of the case.                                                Sandria Bales. Ezzard Standing, M.D.    DHN/MEDQ  D:  12/04/2002  T:  12/04/2002  Job:  [id number]   cc:   Janae Bridgeman. Eloise Harman., M.D.  [address]  [place]  [place] [address]  Fax: [phone]   Anselmo Rod, M.D.  [address]  [address]  [place]  [place] [address]  Fax: [phone]   Areatha Keas, M.D.  [address]  [address]  [place]  [place] [address]  Fax: [phone]

## 2011-04-01 NOTE — Op Note (Signed)
NAME:  Jessica Khan, Jessica Khan                          ACCOUNT NO.:  [id number]   MEDICAL RECORD NO.:  [id number]                   PATIENT TYPE:  AMB   LOCATION:  ENDO                                 FACILITY:  [facility]   PHYSICIAN:  Anselmo Rod, M.D.               DATE OF BIRTH:  [birth year]   DATE OF PROCEDURE:  03/31/2003  DATE OF DISCHARGE:                                 OPERATIVE REPORT   PROCEDURE:  Colonoscopy with snare polypectomy x1 and cold biopsy x1.   ENDOSCOPIST:  Anselmo Rod, M.D.   INSTRUMENT USED:  Olympus video colonoscope (adult scope changed to a  pediatric scope).   INDICATION FOR PROCEDURE:  Change in bowel habits in a 65 year old white  female.  Rule out colonic polyps, masses, etc.   PREPROCEDURE PREPARATION:  Informed consent was procured from the patient.  The patient had fasted for eight hours prior to the procedure and prepped  with a bottle of magnesium citrate and GoLYTELY the night prior to the  procedure.  The patient only consumed a  third of the gallon of GoLYTELY.   PREPROCEDURE PHYSICAL:        VITAL SIGNS:  The patient had stable vital  signs.  NECK:  Supple.                CHEST:  Clear to auscultation.  S1, S2  regular.  ABDOMEN:  Soft with normal bowel sounds.   DESCRIPTION OF PROCEDURE:  The patient was placed in the left lateral  decubitus position and sedated with 70 mg of Demerol and 7 mg of Versed  intravenously.  Once the patient was adequately sedate and maintained on low-  flow oxygen and continuous cardiac monitoring, the Olympus video colonoscope  was advanced from the rectum to the splenic flexure with difficulty.  The  scope could not be advanced beyond this point and as a result, the  colonoscope was withdrawn and an adjustable pediatric scope used instead,  and the scope was passed easily through the splenic flexure up to the cecum.  The appendiceal orifice and the ileocecal valve were clearly visualized and  photographed.   They appeared normal.  The terminal ileum appeared normal as  well.  A small sessile polyp was snared from 40 cm, and a small sessile  polyp was biopsied at the anal verge.  The rest of the colonic mucosa up to  the cecum and terminal ileum appeared normal.  No diverticulosis, large  masses, etc., were seen.  The patient tolerated the procedure well without  complications.  The appendiceal orifice and ileocecal valve were clearly  visualized and photographed.   IMPRESSION:  1. A small sessile polyp biopsied (cold biopsy) at the anal verge.  2. A small sessile polyp snared at 40 cm.  3. Normal-appearing proximal left colon, transverse colon, right colon, and     cecum, including terminal ileum.  RECOMMENDATIONS:  1. Await pathology results.  2. Avoid all nonsteroidals, including aspirin, for the next three to four     weeks.  3. Repeat colorectal cancer screening depending upon the pathology results.  4. A high-fiber diet with liberal fluid intake.  5. Outpatient follow-up on a p.r.n. basis.  1.                                            Anselmo Rod, M.D.    JNM/MEDQ  D:  03/31/2003  T:  03/31/2003  Job:  [id number]   cc:   Janae Bridgeman. Eloise Harman., M.D.  [address]  [place]  [place] [address]  Fax: [phone]   Stann Mainland. Vincente Poli, M.D.  [address]  [place]  [place] [address]  Fax: [phone]   Sandria Bales. Ezzard Standing, M.D.  [address]  [place]  [place] [address]  Fax: [phone]

## 2011-05-10 ENCOUNTER — Other Ambulatory Visit: Payer: Self-pay | Admitting: Internal Medicine

## 2011-05-10 NOTE — Telephone Encounter (Signed)
Ok to Rf? 

## 2011-05-11 ENCOUNTER — Telehealth: Payer: Self-pay | Admitting: *Deleted

## 2011-05-11 NOTE — Telephone Encounter (Signed)
OK to fill this prescription with additional refills x3 Thank you!  

## 2011-05-11 NOTE — Telephone Encounter (Signed)
rec rf req for Zolpidem 10mg  1/2-1 po qhs. # 30. Last filled 04-18-11. Ok to Rf?

## 2011-05-12 MED ORDER — ZOLPIDEM TARTRATE 10 MG PO TABS: 5.0000 mg | ORAL_TABLET | Freq: Every evening | ORAL | Status: DC | PRN

## 2011-06-03 ENCOUNTER — Other Ambulatory Visit: Payer: Self-pay | Admitting: Internal Medicine

## 2011-06-03 ENCOUNTER — Other Ambulatory Visit (INDEPENDENT_AMBULATORY_CARE_PROVIDER_SITE_OTHER): Payer: Commercial Managed Care - PPO

## 2011-06-03 DIAGNOSIS — R7309 Other abnormal glucose: Secondary | ICD-10-CM

## 2011-06-03 LAB — LDL CHOLESTEROL, DIRECT: Direct LDL: 143 mg/dL

## 2011-06-03 LAB — LIPID PANEL
Cholesterol: 202 mg/dL — ABNORMAL HIGH (ref 0–200)
HDL: 48 mg/dL
Total CHOL/HDL Ratio: 4
Triglycerides: 150 mg/dL — ABNORMAL HIGH (ref 0.0–149.0)
VLDL: 30 mg/dL (ref 0.0–40.0)

## 2011-06-03 LAB — BASIC METABOLIC PANEL
BUN: 18 mg/dL (ref 6–23)
CO2: 30 mEq/L (ref 19–32)
Chloride: 108 mEq/L (ref 96–112)
Glucose, Bld: 110 mg/dL — ABNORMAL HIGH (ref 70–99)
Potassium: 4 mEq/L (ref 3.5–5.1)
Sodium: 143 mEq/L (ref 135–145)

## 2011-06-03 LAB — HEMOGLOBIN A1C: Hgb A1c MFr Bld: 6.5 % (ref 4.6–6.5)

## 2011-06-06 ENCOUNTER — Other Ambulatory Visit: Payer: Self-pay | Admitting: Internal Medicine

## 2011-06-09 ENCOUNTER — Encounter: Payer: Self-pay | Admitting: Internal Medicine

## 2011-06-10 ENCOUNTER — Ambulatory Visit (INDEPENDENT_AMBULATORY_CARE_PROVIDER_SITE_OTHER): Payer: Commercial Managed Care - PPO | Admitting: Internal Medicine

## 2011-06-10 ENCOUNTER — Encounter: Payer: Self-pay | Admitting: Internal Medicine

## 2011-06-10 VITALS — BP 140/92 | HR 84 | Temp 97.6°F | Resp 16 | Ht 62.5 in | Wt 182.0 lb

## 2011-06-10 DIAGNOSIS — F17200 Nicotine dependence, unspecified, uncomplicated: Secondary | ICD-10-CM

## 2011-06-10 DIAGNOSIS — I10 Essential (primary) hypertension: Secondary | ICD-10-CM

## 2011-06-10 DIAGNOSIS — IMO0001 Reserved for inherently not codable concepts without codable children: Secondary | ICD-10-CM

## 2011-06-10 DIAGNOSIS — K219 Gastro-esophageal reflux disease without esophagitis: Secondary | ICD-10-CM

## 2011-06-10 DIAGNOSIS — E538 Deficiency of other specified B group vitamins: Secondary | ICD-10-CM

## 2011-06-10 MED ORDER — CARVEDILOL 25 MG PO TABS: 25.0000 mg | ORAL_TABLET | Freq: Two times a day (BID) | ORAL | Status: DC

## 2011-06-10 MED ORDER — PRAVASTATIN SODIUM 20 MG PO TABS: 20.0000 mg | ORAL_TABLET | Freq: Every evening | ORAL | Status: DC

## 2011-06-10 NOTE — Progress Notes (Signed)
  Subjective:    Patient ID: Jessica Khan, female    DOB: June 08, 1946, 65 y.o.   MRN: [id number]  HPI  The patient presents for a follow-up of  chronic hypertension, chronic dyslipidemia, type 2 pre-diabetes controlled with medicines    Review of Systems  Constitutional: Negative for chills, activity change, appetite change, fatigue and unexpected weight change.  HENT: Negative for congestion, mouth sores and sinus pressure.   Eyes: Negative for visual disturbance.  Respiratory: Negative for cough and chest tightness.   Gastrointestinal: Negative for nausea and abdominal pain.  Genitourinary: Negative for frequency, difficulty urinating and vaginal pain.  Musculoskeletal: Negative for back pain and gait problem.  Skin: Negative for pallor and rash.  Neurological: Negative for dizziness, tremors, weakness, numbness and headaches.  Psychiatric/Behavioral: Negative for confusion and sleep disturbance.       Objective:   Physical Exam  Constitutional: She appears well-developed and well-nourished. No distress.  HENT:  Head: Normocephalic.  Right Ear: External ear normal.  Left Ear: External ear normal.  Nose: Nose normal.  Mouth/Throat: Oropharynx is clear and moist.  Eyes: Conjunctivae are normal. Pupils are equal, round, and reactive to light. Right eye exhibits no discharge. Left eye exhibits no discharge.  Neck: Normal range of motion. Neck supple. No JVD present. No tracheal deviation present. No thyromegaly present.  Cardiovascular: Normal rate, regular rhythm and normal heart sounds.   Pulmonary/Chest: No stridor. No respiratory distress. She has no wheezes.  Abdominal: Soft. Bowel sounds are normal. She exhibits no distension and no mass. There is no tenderness. There is no rebound and no guarding.  Musculoskeletal: She exhibits no edema and no tenderness.  Lymphadenopathy:    She has no cervical adenopathy.  Neurological: She displays normal reflexes. No cranial nerve  deficit. She exhibits normal muscle tone. Coordination normal.  Skin: No rash noted. No erythema.  Psychiatric: She has a normal mood and affect. Her behavior is normal. Judgment and thought content normal.    Lab Results  Component Value Date   WBC 5.6 12/02/2010   HGB 13.2 12/02/2010   HCT 38.5 12/02/2010   PLT 202.0 12/02/2010   CHOL 202* 06/03/2011   TRIG 150.0* 06/03/2011   HDL 48.00 06/03/2011   LDLDIRECT 143.0 06/03/2011   ALT 31 12/02/2010   AST 25 12/02/2010   NA 143 06/03/2011   K 4.0 06/03/2011   CL 108 06/03/2011   CREATININE 0.6 06/03/2011   BUN 18 06/03/2011   CO2 30 06/03/2011   TSH 2.10 12/02/2010   HGBA1C 6.5 06/03/2011         Assessment & Plan:

## 2011-06-10 NOTE — Patient Instructions (Signed)
Wt Readings from Last 3 Encounters:  06/10/11 182 lb (82.555 kg)  12/06/10 183 lb (83.008 kg)  06/10/10 184 lb (83.462 kg)

## 2011-06-13 DIAGNOSIS — F17200 Nicotine dependence, unspecified, uncomplicated: Secondary | ICD-10-CM | POA: Insufficient documentation

## 2011-06-13 NOTE — Assessment & Plan Note (Addendum)
On Rx 

## 2011-06-13 NOTE — Assessment & Plan Note (Addendum)
Discussed.

## 2011-06-13 NOTE — Assessment & Plan Note (Addendum)
BP Readings from Last 3 Encounters:  06/10/11 140/92  12/06/10 160/90  05/31/10 110/70  Rx discussed - see Meds

## 2011-06-13 NOTE — Assessment & Plan Note (Addendum)
On Rx prn 

## 2011-06-18 ENCOUNTER — Encounter: Payer: Self-pay | Admitting: Internal Medicine

## 2011-06-18 ENCOUNTER — Encounter: Payer: Self-pay | Admitting: *Deleted

## 2011-08-26 LAB — URINE MICROSCOPIC-ADD ON

## 2011-08-26 LAB — URINALYSIS, ROUTINE W REFLEX MICROSCOPIC
Ketones, ur: NEGATIVE
Nitrite: NEGATIVE
Specific Gravity, Urine: >1.03 — ABNORMAL HIGH
Urobilinogen, UA: 0.2
pH: 5.5

## 2011-08-26 LAB — POCT URINALYSIS DIP (DEVICE)
Glucose, UA: NEGATIVE
Ketones, ur: NEGATIVE
Nitrite: NEGATIVE
Operator id: 200941
Protein, ur: 30 — AB
Specific Gravity, Urine: >=1.03
Urobilinogen, UA: 0.2
pH: 6

## 2011-08-26 LAB — URINE CULTURE: Colony Count: >=100000

## 2011-09-27 ENCOUNTER — Telehealth: Payer: Self-pay | Admitting: Student

## 2011-09-27 NOTE — Telephone Encounter (Signed)
Refill request for Zolpidem 10mg  1/2-1 po at bedtime. 30. Last fill 08/29/11. Ok to refill?

## 2011-09-27 NOTE — Telephone Encounter (Signed)
OK to fill this prescription with additional refills x3 Thank you!  

## 2011-09-28 ENCOUNTER — Other Ambulatory Visit: Payer: Self-pay | Admitting: Internal Medicine

## 2011-09-28 MED ORDER — ZOLPIDEM TARTRATE 10 MG PO TABS
5.0000 mg | ORAL_TABLET | Freq: Every evening | ORAL | Status: DC | PRN
Start: 2011-09-28 — End: 2012-01-31

## 2011-09-28 NOTE — Telephone Encounter (Signed)
Pt informed.   Rx phoned in.

## 2011-09-28 NOTE — Telephone Encounter (Signed)
This pt called in and stated the pharmacy does not have her refill.  I saw on 11/14 the med was approved by Dr.Plotnikov.  Please advise if this can be re-sent to the pharmacy.   Thanks !

## 2011-10-31 ENCOUNTER — Other Ambulatory Visit (HOSPITAL_COMMUNITY): Payer: Self-pay | Admitting: Obstetrics and Gynecology

## 2011-10-31 DIAGNOSIS — Z1231 Encounter for screening mammogram for malignant neoplasm of breast: Secondary | ICD-10-CM

## 2011-11-30 ENCOUNTER — Other Ambulatory Visit: Payer: Self-pay | Admitting: *Deleted

## 2011-12-02 ENCOUNTER — Ambulatory Visit (HOSPITAL_COMMUNITY): Payer: Commercial Managed Care - PPO

## 2011-12-05 ENCOUNTER — Other Ambulatory Visit: Payer: Self-pay | Admitting: *Deleted

## 2011-12-05 MED ORDER — SPIRONOLACTONE 25 MG PO TABS: 25.0000 mg | ORAL_TABLET | Freq: Every day | ORAL | Status: DC

## 2011-12-06 ENCOUNTER — Other Ambulatory Visit: Payer: Commercial Managed Care - PPO

## 2011-12-09 ENCOUNTER — Ambulatory Visit: Payer: Commercial Managed Care - PPO | Admitting: Internal Medicine

## 2011-12-29 ENCOUNTER — Ambulatory Visit (HOSPITAL_COMMUNITY)
Admission: RE | Admit: 2011-12-29 | Discharge: 2011-12-29 | Disposition: A | Discharge status: Home / Self Care | Payer: Commercial Managed Care - PPO | Source: Ambulatory Visit | Attending: Obstetrics and Gynecology | Admitting: Obstetrics and Gynecology

## 2011-12-29 DIAGNOSIS — Z1231 Encounter for screening mammogram for malignant neoplasm of breast: Secondary | ICD-10-CM | POA: Insufficient documentation

## 2012-01-30 ENCOUNTER — Telehealth: Payer: Self-pay | Admitting: *Deleted

## 2012-01-30 NOTE — Telephone Encounter (Signed)
Rf req for Zolpidem 10 mg 1/2-1 po qhs # 30. Last filled 12-29-11. Ok to Rf?

## 2012-01-31 MED ORDER — ZOLPIDEM TARTRATE 10 MG PO TABS: 5.0000 mg | ORAL_TABLET | Freq: Every evening | ORAL | Status: DC | PRN

## 2012-01-31 NOTE — Telephone Encounter (Signed)
Done

## 2012-01-31 NOTE — Telephone Encounter (Signed)
OK to fill this prescription with additional refills x3 Thank you!  

## 2012-02-07 ENCOUNTER — Other Ambulatory Visit: Payer: Self-pay | Admitting: Obstetrics and Gynecology

## 2012-02-07 IMAGING — MG MM DIGITAL SCREENING
1 series · 1 of 1 positions shown · non-contrast
Comparison: none

DG SCREEN MAMMOGRAM BILATERAL
Bilateral CC and MLO view(s) were taken.

DIGITAL SCREENING MAMMOGRAM WITH CAD:
There are scattered fibroglandular densities.  No masses or malignant type calcifications are 
identified.  Compared with prior studies.
Images were processed with CAD.

[L CC]
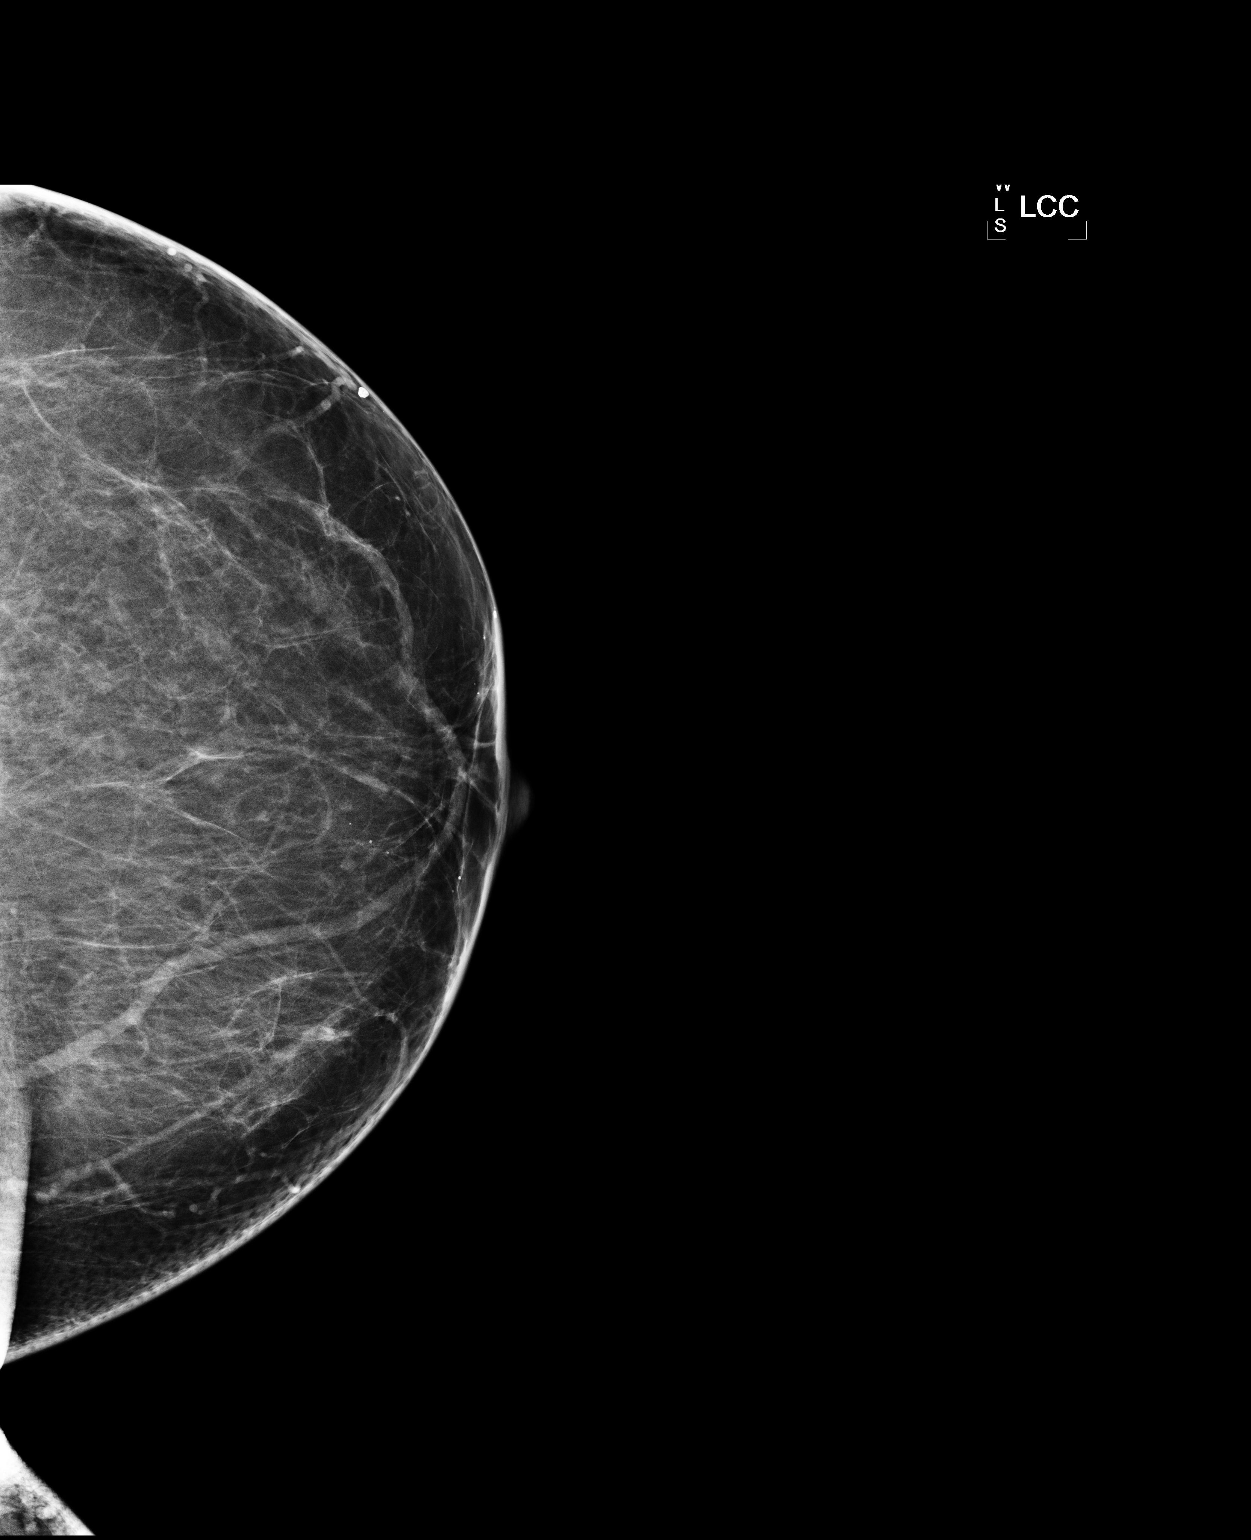

[1 of 1 positions shown; findings below may reference images not displayed]

IMPRESSION: No specific mammographic evidence of malignancy.  Next screening mammogram is recommended in one 
year.

A result letter of this screening mammogram will be mailed directly to the patient.

ASSESSMENT: Negative - BI-RADS 1

Screening mammogram in 1 year.
,

## 2012-02-09 ENCOUNTER — Telehealth: Payer: Self-pay | Admitting: *Deleted

## 2012-02-09 DIAGNOSIS — I10 Essential (primary) hypertension: Secondary | ICD-10-CM

## 2012-02-09 MED ORDER — OLMESARTAN MEDOXOMIL 40 MG PO TABS: 40.0000 mg | ORAL_TABLET | Freq: Every day | ORAL | Status: DC

## 2012-02-09 NOTE — Telephone Encounter (Signed)
Pt states that she had an appointment with her GYN yesterday and that her BP was 176/110. She has an upcoming appointment with Dr. Posey Rea on 4/15 and wants to know if her rx needs to be increased before her appointment.

## 2012-02-09 NOTE — Telephone Encounter (Signed)
Pt informed of MD's advisement. 

## 2012-02-09 NOTE — Telephone Encounter (Signed)
Start benicar 

## 2012-02-27 ENCOUNTER — Other Ambulatory Visit (INDEPENDENT_AMBULATORY_CARE_PROVIDER_SITE_OTHER): Payer: 59

## 2012-02-27 ENCOUNTER — Other Ambulatory Visit: Payer: Self-pay | Admitting: Internal Medicine

## 2012-02-27 ENCOUNTER — Ambulatory Visit (INDEPENDENT_AMBULATORY_CARE_PROVIDER_SITE_OTHER): Payer: 59 | Admitting: Internal Medicine

## 2012-02-27 ENCOUNTER — Other Ambulatory Visit: Payer: Self-pay | Admitting: *Deleted

## 2012-02-27 ENCOUNTER — Encounter: Payer: Self-pay | Admitting: Internal Medicine

## 2012-02-27 VITALS — BP 150/94 | HR 84 | Temp 98.4°F | Resp 16 | Wt 184.0 lb

## 2012-02-27 DIAGNOSIS — J449 Chronic obstructive pulmonary disease, unspecified: Secondary | ICD-10-CM

## 2012-02-27 DIAGNOSIS — J4489 Other specified chronic obstructive pulmonary disease: Secondary | ICD-10-CM

## 2012-02-27 DIAGNOSIS — Z0000 Encounter for general adult medical examination without abnormal findings: Secondary | ICD-10-CM

## 2012-02-27 DIAGNOSIS — I10 Essential (primary) hypertension: Secondary | ICD-10-CM

## 2012-02-27 DIAGNOSIS — J309 Allergic rhinitis, unspecified: Secondary | ICD-10-CM

## 2012-02-27 DIAGNOSIS — E538 Deficiency of other specified B group vitamins: Secondary | ICD-10-CM

## 2012-02-27 DIAGNOSIS — F17200 Nicotine dependence, unspecified, uncomplicated: Secondary | ICD-10-CM

## 2012-02-27 LAB — BASIC METABOLIC PANEL
BUN: 20 mg/dL (ref 6–23)
Calcium: 9.3 mg/dL (ref 8.4–10.5)
GFR: 70.53 mL/min (ref >60.00)
Glucose, Bld: 95 mg/dL (ref 70–99)
Potassium: 4.3 mEq/L (ref 3.5–5.1)

## 2012-02-27 MED ORDER — OLMESARTAN-AMLODIPINE-HCTZ 40-5-12.5 MG PO TABS: 1.0000 | ORAL_TABLET | ORAL | Status: DC

## 2012-02-27 NOTE — Assessment & Plan Note (Signed)
Discussed --try Blue light

## 2012-02-27 NOTE — Progress Notes (Signed)
Patient ID: Jessica Khan, female   DOB: 1946-06-02, 66 y.o.   MRN: [id number]  Subjective:    Patient ID: Jessica Khan, female    DOB: 1946/05/25, 66 y.o.   MRN: [id number]  Headache  Pertinent negatives include no abdominal pain, back pain, coughing, dizziness, nausea, numbness, sinus pressure or weakness. Her past medical history is significant for hypertension.  Hypertension Associated symptoms include headaches.    The patient presents for a follow-up of  chronic hypertension, chronic dyslipidemia, type 2 pre-diabetes controlled with medicines. She stopped Pravachol  BP Readings from Last 3 Encounters:  02/27/12 150/94  06/10/11 140/92  12/06/10 160/90   Wt Readings from Last 3 Encounters:  02/27/12 184 lb (83.462 kg)  06/10/11 182 lb (82.555 kg)  12/06/10 183 lb (83.008 kg)      Review of Systems  Constitutional: Negative for chills, activity change, appetite change, fatigue and unexpected weight change.  HENT: Negative for congestion, mouth sores and sinus pressure.   Eyes: Negative for visual disturbance.  Respiratory: Negative for cough and chest tightness.   Gastrointestinal: Negative for nausea and abdominal pain.  Genitourinary: Negative for frequency, difficulty urinating and vaginal pain.  Musculoskeletal: Negative for back pain and gait problem.  Skin: Negative for pallor and rash.  Neurological: Positive for headaches. Negative for dizziness, tremors, weakness and numbness.  Psychiatric/Behavioral: Negative for confusion and sleep disturbance.       Objective:   Physical Exam  Constitutional: She appears well-developed and well-nourished. No distress.  HENT:  Head: Normocephalic.  Right Ear: External ear normal.  Left Ear: External ear normal.  Nose: Nose normal.  Mouth/Throat: Oropharynx is clear and moist.  Eyes: Conjunctivae are normal. Pupils are equal, round, and reactive to light. Right eye exhibits no discharge. Left eye exhibits no discharge.   Neck: Normal range of motion. Neck supple. No JVD present. No tracheal deviation present. No thyromegaly present.  Cardiovascular: Normal rate, regular rhythm and normal heart sounds.   Pulmonary/Chest: No stridor. No respiratory distress. She has no wheezes.  Abdominal: Soft. Bowel sounds are normal. She exhibits no distension and no mass. There is no tenderness. There is no rebound and no guarding.  Musculoskeletal: She exhibits no edema and no tenderness.  Lymphadenopathy:    She has no cervical adenopathy.  Neurological: She displays normal reflexes. No cranial nerve deficit. She exhibits normal muscle tone. Coordination normal.  Skin: No rash noted. No erythema.  Psychiatric: She has a normal mood and affect. Her behavior is normal. Judgment and thought content normal.    Lab Results  Component Value Date   WBC 5.6 12/02/2010   HGB 13.2 12/02/2010   HCT 38.5 12/02/2010   PLT 202.0 12/02/2010   CHOL 202* 06/03/2011   TRIG 150.0* 06/03/2011   HDL 48.00 06/03/2011   LDLDIRECT 143.0 06/03/2011   ALT 31 12/02/2010   AST 25 12/02/2010   NA 143 06/03/2011   K 4.0 06/03/2011   CL 108 06/03/2011   CREATININE 0.6 06/03/2011   BUN 18 06/03/2011   CO2 30 06/03/2011   TSH 2.10 12/02/2010   HGBA1C 6.5 06/03/2011         Assessment & Plan:

## 2012-02-27 NOTE — Assessment & Plan Note (Signed)
Continue with current prescription therapy as reflected on the Med list.  

## 2012-02-27 NOTE — Patient Instructions (Addendum)
Take Tribenzor in place of Spironolactone and Benicar Normal BP<130/85

## 2012-02-27 NOTE — Assessment & Plan Note (Signed)
Chronic, unknown degree Needs to d/c cigs

## 2012-02-27 NOTE — Assessment & Plan Note (Signed)
See med change 

## 2012-03-02 ENCOUNTER — Other Ambulatory Visit: Payer: Self-pay | Admitting: *Deleted

## 2012-03-02 NOTE — Telephone Encounter (Signed)
Pt left vm stating Tribenzor is not covered by ins. Tried calling pt to see if she is completely out- No answer/machine.  Please advise.

## 2012-03-04 NOTE — Telephone Encounter (Signed)
What is covered? Thx 

## 2012-03-06 NOTE — Telephone Encounter (Signed)
We can switch to valsart-HCTZ and Amlodipine -- see Rx Thx

## 2012-03-06 NOTE — Telephone Encounter (Signed)
Pt doesn't know. I called pharmacy- they don't know. Can you change to similar generic?

## 2012-03-07 MED ORDER — AMLODIPINE BESYLATE 5 MG PO TABS: 5.0000 mg | ORAL_TABLET | Freq: Every day | ORAL | Status: DC

## 2012-03-07 MED ORDER — VALSARTAN-HYDROCHLOROTHIAZIDE 320-12.5 MG PO TABS: 1.0000 | ORAL_TABLET | Freq: Every day | ORAL | Status: AC

## 2012-03-07 NOTE — Telephone Encounter (Signed)
Rf sent to [facility] outpatient pharm. Left detailed mess informing pt.

## 2012-03-11 ENCOUNTER — Encounter: Payer: Self-pay | Admitting: Internal Medicine

## 2012-03-13 ENCOUNTER — Telehealth: Payer: Self-pay | Admitting: Internal Medicine

## 2012-03-13 NOTE — Telephone Encounter (Signed)
OK to fill this prescription with additional refills x5 Thank you!  

## 2012-03-13 NOTE — Telephone Encounter (Signed)
Pt req refill for CYMBALTA 60MG  CAP, Pt stated that Dr. Vincente Poli gave her this med, but she is out of this med and would like Dr. Posey Rea to refill this Please. Please call in [facility] pharmacy in [place]. Please call pt when done.

## 2012-03-14 MED ORDER — DULOXETINE HCL 60 MG PO CPEP: 60.0000 mg | ORAL_CAPSULE | Freq: Every day | ORAL | Status: DC

## 2012-03-14 NOTE — Telephone Encounter (Signed)
Done. Pt informed.

## 2012-03-26 ENCOUNTER — Other Ambulatory Visit: Payer: Self-pay | Admitting: Internal Medicine

## 2012-04-16 ENCOUNTER — Telehealth: Payer: Self-pay

## 2012-04-16 MED ORDER — ESCITALOPRAM OXALATE 20 MG PO TABS: 20.0000 mg | ORAL_TABLET | Freq: Every day | ORAL | Status: DC

## 2012-04-16 NOTE — Telephone Encounter (Signed)
No need to taper. Take Lexapro next day after you took your last Cymbalta Thx

## 2012-04-16 NOTE — Telephone Encounter (Signed)
Pt advised via VM 

## 2012-04-16 NOTE — Telephone Encounter (Signed)
Pt advised of new Rx but is requesting MD instruction on how to taper off Cymbalta, please advise.

## 2012-04-16 NOTE — Telephone Encounter (Signed)
We can try Lexapro 20 mg/d, but it is not the same as Cymbalta - there is no generic form of it... ROV in 2 mo Thx

## 2012-04-16 NOTE — Telephone Encounter (Signed)
Pt called stating that since retiring the cost of Cymbalta is now almost $100/month. Pt says that she previously used Prozac which did not work as well as the Cymbalta but would be more affordable. Pt is request a cheaper and equally effective alternative. Pt is also requesting advisement on how to wean herself off of the Cymbalta and will require short term Rx for this.

## 2012-04-20 ENCOUNTER — Telehealth: Payer: Self-pay | Admitting: Internal Medicine

## 2012-04-20 NOTE — Telephone Encounter (Signed)
Caller: Marylene Buerger; PCP: Sonda Primes; CB#: [phone]; Call regarding Very Tired, No Energy (started New B/P Meds Recently);  Onset 04/17/12.  Temp unknown; chilled at times, especially evening.  Per Epic, started on Tribenzor in May. Advised to see MD within 24 hrs for persistant fatigue that came on suddenly without known cause per Fatigue Guideline.  No appts remain for 04/20/12; called office /Carolyn who advised pt must all after 1300 for appt 04/21/12.

## 2012-04-20 NOTE — Telephone Encounter (Signed)
Try Tribenzor 1/2 tab a day - see if better in 4-5 d OV if not better Thx

## 2012-04-21 ENCOUNTER — Encounter: Payer: Self-pay | Admitting: Family Medicine

## 2012-04-21 ENCOUNTER — Ambulatory Visit (INDEPENDENT_AMBULATORY_CARE_PROVIDER_SITE_OTHER): Payer: PRIVATE HEALTH INSURANCE | Admitting: Family Medicine

## 2012-04-21 VITALS — BP 70/50 | HR 80 | Temp 98.0°F

## 2012-04-21 DIAGNOSIS — R52 Pain, unspecified: Secondary | ICD-10-CM

## 2012-04-21 DIAGNOSIS — I959 Hypotension, unspecified: Secondary | ICD-10-CM | POA: Insufficient documentation

## 2012-04-21 DIAGNOSIS — N390 Urinary tract infection, site not specified: Secondary | ICD-10-CM | POA: Insufficient documentation

## 2012-04-21 LAB — POCT URINALYSIS DIPSTICK
Bilirubin, UA: NEGATIVE
Nitrite, UA: POSITIVE
Urobilinogen, UA: NEGATIVE
pH, UA: 5

## 2012-04-21 MED ORDER — SULFAMETHOXAZOLE-TRIMETHOPRIM 800-160 MG PO TABS: 1.0000 | ORAL_TABLET | Freq: Two times a day (BID) | ORAL | Status: DC

## 2012-04-21 NOTE — Assessment & Plan Note (Addendum)
Newly on amlodipine and coreg and pt also has uti  Not orthostatic here  Will hold coreg  Very mild lightheadedness Will drink lots of fluids Call next week for f/u

## 2012-04-21 NOTE — Assessment & Plan Note (Signed)
Pos ua with 2 d of aches and fever (none now)  tx with septra DS Unable to get cx-not enough volume Call for f/u next week Update if not starting to improve in several days or if worsening

## 2012-04-21 NOTE — Patient Instructions (Signed)
Hold the coreg - your bp is just a bit too low  Take the septra DS for urinary tract infection Drink lots of water  If more dizzy - call or if worse fever Call office on Monday to check in with Dr Demetrius Charity and plan a follow up appt

## 2012-04-21 NOTE — Progress Notes (Signed)
Subjective:    Patient ID: Jessica Khan, female    DOB: [birth year], 66 y.o.   MRN: [id number]  HPI Here for body aches and generally not feeling well  tues afternoon started aching all over , wed a bit better, then worse the past 2 days 101.6  Took some tylenol last night    No uri symptoms No uti symptoms --no frequency or burning   Amlodipine and coreg are new - for high bp  2-3 d ago  bp is 70/50 today  A bit light headed today  She did take all her medicine this am No fever this am   Patient Active Problem List  Diagnoses  . VITAMIN B12 DEFICIENCY  . TOBACCO USE DISORDER/SMOKER-SMOKING CESSATION DISCUSSED  . CARPAL TUNNEL SYNDROME  . HYPERTENSION  . HEMORRHOIDS, INTERNAL  . ALLERGIC RHINITIS  . GERD  . DIVERTICULOSIS, COLON  . IRRITABLE BOWEL SYNDROME  . ACUTE CYSTITIS  . UTI  . FIBROMYALGIA  . OSTEOPOROSIS  . PARESTHESIA  . CAROTID BRUIT, LEFT, LOCAL  . CHEST PAIN  . HYPERGLYCEMIA  . EAR PAIN  . COPD  . Tobacco dependence  . UTI (lower urinary tract infection)  . Hypotension   Past Medical History  Diagnosis Date  . GERD (gastroesophageal reflux disease)   . Hypertension   . Osteoporosis   . Fibromyalgia   . IBS (irritable bowel syndrome)   . Diverticulosis of colon     Dr. Loreta Ave  . Hemangioma     T 10- Dr. Jule Ser  . CTS (carpal tunnel syndrome)     bilateral  . Allergic rhinitis   . Elevated glucose 2011  . COPD (chronic obstructive pulmonary disease)     due to smoking  . Acute cystitis 12/25/2007  . ALLERGIC RHINITIS 11/30/2009  . CAROTID BRUIT, LEFT, LOCAL 02/25/2008  . Carpal tunnel syndrome 12/23/2008  . CHEST PAIN 05/31/2010  . COPD 12/06/2010  . DIVERTICULOSIS, COLON 02/25/2008  . EAR PAIN 12/06/2010  . FIBROMYALGIA 09/25/2007  . GERD 09/25/2007  . HEMORRHOIDS, INTERNAL 05/15/2008  . HYPERGLYCEMIA 05/31/2010  . HYPERTENSION 09/25/2007  . Irritable bowel syndrome 05/19/2008  . OSTEOPOROSIS 09/25/2007  . PARESTHESIA 12/23/2008  . TOBACCO  USE DISORDER/SMOKER-SMOKING CESSATION DISCUSSED 02/25/2008  . VITAMIN B12 DEFICIENCY 01/21/2009   Past Surgical History  Procedure Date  . Cholecystectomy   . Abdominal hysterectomy     partial  . Shoulder surgery     Left shoulder impengement repair  . Foot osteotomy     Right   History  Substance Use Topics  . Smoking status: Current Everyday Smoker -- 0.2 packs/day  . Smokeless tobacco: Not on file  . Alcohol Use: No   Family History  Problem Relation Age of Onset  . Heart disease Mother 52  . Cancer Father     Bladder  . Diabetes Maternal Grandmother   . Diabetes Maternal Grandfather   . Cancer Other     Breast- Aunts  . Hypertension Other    Allergies  Allergen Reactions  . Ciprofloxacin     REACTION: nausea  . Metoprolol Succinate     REACTION: fatigue   Current Outpatient Prescriptions on File Prior to Visit  Medication Sig Dispense Refill  . amLODipine (NORVASC) 5 MG tablet Take 1 tablet (5 mg total) by mouth daily.  90 tablet  3  . aspirin 81 MG tablet Take 81 mg by mouth daily.        . cetirizine (ZYRTEC) 10 MG tablet Take  10 mg by mouth daily as needed.        . Cholecalciferol 1000 UNITS capsule Take 1,000 Units by mouth daily.        . cyanocobalamin 1000 MCG tablet Take 100 mcg by mouth daily.        Marland Kitchen escitalopram (LEXAPRO) 20 MG tablet Take 1 tablet (20 mg total) by mouth daily.  30 tablet  5  . fluticasone (FLONASE) 50 MCG/ACT nasal spray Place 1 spray into the nose daily.        . Magnesium Aspartate 65 MG TABS Take 1 tablet by mouth daily.        Marland Kitchen NEXIUM 40 MG capsule TAKE 1 CAPSULE BY MOUTH TWICE DAILY  180 capsule  1  . polyethylene glycol powder (MIRALAX) powder Take 17 g by mouth daily.        . valsartan-hydrochlorothiazide (DIOVAN-HCT) 320-12.5 MG per tablet Take 1 tablet by mouth daily.  90 tablet  3  . zolpidem (AMBIEN) 10 MG tablet Take 0.5-1 tablets (5-10 mg total) by mouth at bedtime as needed for sleep.  30 tablet  3      Review of  Systems Review of Systems  Constitutional: pos for intermittent fever/ achiness/ fatigue/malaise  Eyes: Negative for pain and visual disturbance.  ENT neg for st or ha or congestion  Respiratory: Negative for cough and wheeze or sob or cp Cardiovascular: Negative for cp or palpitations    Gastrointestinal: Negative for nausea, diarrhea and constipation.  Genitourinary: Negative for urgency and frequency. neg for blood in urine Skin: Negative for pallor or rash neg fot tick or other insect bites  MSK pos for generalized achiness / neg for swollen or red joints   Neurological: Negative for weakness, light-headedness, numbness and headaches.  Hematological: Negative for adenopathy. Does not bruise/bleed easily.  Psychiatric/Behavioral: Negative for dysphoric mood. The patient is not nervous/anxious.         Objective:   Physical Exam  Constitutional: She appears well-developed and well-nourished. No distress.  HENT:  Head: Normocephalic and atraumatic.  Right Ear: External ear normal.  Left Ear: External ear normal.  Nose: Nose normal.  Mouth/Throat: Oropharynx is clear and moist.  Eyes: Conjunctivae and EOM are normal. Pupils are equal, round, and reactive to light. Right eye exhibits no discharge. Left eye exhibits no discharge.  Neck: Normal range of motion. Neck supple. No JVD present. No thyromegaly present.  Cardiovascular: Normal rate, regular rhythm, normal heart sounds and intact distal pulses.  Exam reveals no gallop.   No murmur heard. Pulmonary/Chest: Effort normal and breath sounds normal. No respiratory distress. She has no wheezes.       Diffusely distant bs   Abdominal: Soft. Bowel sounds are normal. She exhibits no distension and no mass. There is no tenderness.       Diffusely distant bs   Musculoskeletal: She exhibits no edema and no tenderness.       No cva tenderness   Lymphadenopathy:    She has no cervical adenopathy.  Neurological: She is alert. She has  normal reflexes. She displays no atrophy and no tremor. No cranial nerve deficit. She exhibits normal muscle tone. Coordination and gait normal.  Skin: Skin is warm and dry. No rash noted. No erythema. No pallor.  Psychiatric: She has a normal mood and affect.          Assessment & Plan:

## 2012-04-23 ENCOUNTER — Telehealth: Payer: Self-pay | Admitting: Internal Medicine

## 2012-04-23 NOTE — Telephone Encounter (Signed)
Caller: Velva/Patient; PCP: Sonda Primes; CB#: [phone]; Call regarding seen 04/21/12 for  UTI; Low BP;  Fever gone, feels weak.  States she stopped BP meds as it was so low.  Carvedilol stopped per instructions.  She has also not taken BP meds on 6/9 or 6/10 due to feeling weak.   BP 70/40 at office on 04/21/12; 127/87 on 6/10,  as low as 97/67 and "all over the place"... highest 144/104.  Call provider within 8 hours per  HTN protocol.  Note to provider for feedback/ follow up per  Hypertension, Diagnosed or Suspected protocol.

## 2012-04-23 NOTE — Telephone Encounter (Signed)
OV this week  w/any MD please Thx

## 2012-04-27 ENCOUNTER — Ambulatory Visit (INDEPENDENT_AMBULATORY_CARE_PROVIDER_SITE_OTHER): Payer: PRIVATE HEALTH INSURANCE | Admitting: Internal Medicine

## 2012-04-27 ENCOUNTER — Encounter: Payer: Self-pay | Admitting: Internal Medicine

## 2012-04-27 VITALS — BP 160/88 | HR 78 | Temp 99.1°F | Ht 62.0 in | Wt 179.2 lb

## 2012-04-27 DIAGNOSIS — J449 Chronic obstructive pulmonary disease, unspecified: Secondary | ICD-10-CM

## 2012-04-27 DIAGNOSIS — N390 Urinary tract infection, site not specified: Secondary | ICD-10-CM

## 2012-04-27 DIAGNOSIS — R7309 Other abnormal glucose: Secondary | ICD-10-CM

## 2012-04-27 DIAGNOSIS — I10 Essential (primary) hypertension: Secondary | ICD-10-CM

## 2012-04-27 NOTE — Patient Instructions (Addendum)
Please re-start the diovan-HCT today (valsartan-HCT) I think you will likely be able to re-start the amlodipine 5 mg next Tuesday if the Blood Pressures are > 140/90 Then will likely be able to re-start the Carvedilol within 5-10 days after that, but please only re-start if your Blood Pressures at home arre fairly consistently more than 140/90

## 2012-04-28 NOTE — Progress Notes (Signed)
Subjective:    Patient ID: Jessica Khan, female    DOB: 1946-01-18, 66 y.o.   MRN: [id number]  HPI  Here to f/; was seen recently per sat clinic with UTI and relative hypotension, tx with antibx - has 2 pills left and  Denies urinary symptoms such as dysuria, frequency, urgency,or hematuria.  Has been diligently checking her BP at home daily with steady increased BP over 140 sbp , today over 160.  Has been on 3 rx meds prior to UTI, asked to hold the coreg but has actually held all 3 meds.  Pt very frustrated over inability to communicate by phone with the office, states tried 3 times and no response so made OV.  Pt denies chest pain, increased sob or doe, wheezing, orthopnea, PND, increased LE swelling, palpitations, dizziness or syncope.  Pt denies new neurological symptoms such as new headache, or facial or extremity weakness or numbness   Pt denies polydipsia, polyuria.   Past Medical History  Diagnosis Date  . GERD (gastroesophageal reflux disease)   . Hypertension   . Osteoporosis   . Fibromyalgia   . IBS (irritable bowel syndrome)   . Diverticulosis of colon     Dr. Loreta Ave  . Hemangioma     T 10- Dr. Jule Ser  . CTS (carpal tunnel syndrome)     bilateral  . Allergic rhinitis   . Elevated glucose 2011  . COPD (chronic obstructive pulmonary disease)     due to smoking  . Acute cystitis 12/25/2007  . ALLERGIC RHINITIS 11/30/2009  . CAROTID BRUIT, LEFT, LOCAL 02/25/2008  . Carpal tunnel syndrome 12/23/2008  . CHEST PAIN 05/31/2010  . COPD 12/06/2010  . DIVERTICULOSIS, COLON 02/25/2008  . EAR PAIN 12/06/2010  . FIBROMYALGIA 09/25/2007  . GERD 09/25/2007  . HEMORRHOIDS, INTERNAL 05/15/2008  . HYPERGLYCEMIA 05/31/2010  . HYPERTENSION 09/25/2007  . Irritable bowel syndrome 05/19/2008  . OSTEOPOROSIS 09/25/2007  . PARESTHESIA 12/23/2008  . TOBACCO USE DISORDER/SMOKER-SMOKING CESSATION DISCUSSED 02/25/2008  . VITAMIN B12 DEFICIENCY 01/21/2009   Past Surgical History  Procedure Date  .  Cholecystectomy   . Abdominal hysterectomy     partial  . Shoulder surgery     Left shoulder impengement repair  . Foot osteotomy     Right    reports that she has been smoking.  She does not have any smokeless tobacco history on file. She reports that she does not drink alcohol or use illicit drugs. family history includes Cancer in her father and other; Diabetes in her maternal grandfather and maternal grandmother; Heart disease (age of onset:77) in her mother; and Hypertension in her other. Allergies  Allergen Reactions  . Ciprofloxacin     REACTION: nausea  . Metoprolol Succinate     REACTION: fatigue   Current Outpatient Prescriptions on File Prior to Visit  Medication Sig Dispense Refill  . aspirin 81 MG tablet Take 81 mg by mouth daily.        . cetirizine (ZYRTEC) 10 MG tablet Take 10 mg by mouth daily as needed.        . Cholecalciferol 1000 UNITS capsule Take 1,000 Units by mouth daily.        . cyanocobalamin 1000 MCG tablet Take 100 mcg by mouth daily.        Marland Kitchen escitalopram (LEXAPRO) 20 MG tablet Take 1 tablet (20 mg total) by mouth daily.  30 tablet  5  . fluticasone (FLONASE) 50 MCG/ACT nasal spray Place 1 spray into the nose  daily.        . Magnesium Aspartate 65 MG TABS Take 1 tablet by mouth daily.        Marland Kitchen NEXIUM 40 MG capsule TAKE 1 CAPSULE BY MOUTH TWICE DAILY  180 capsule  1  . sulfamethoxazole-trimethoprim (BACTRIM DS,SEPTRA DS) 800-160 MG per tablet Take 1 tablet by mouth 2 (two) times daily.  14 tablet  0  . amLODipine (NORVASC) 5 MG tablet Take 1 tablet (5 mg total) by mouth daily.  90 tablet  3  . polyethylene glycol powder (MIRALAX) powder Take 17 g by mouth daily.        . valsartan-hydrochlorothiazide (DIOVAN-HCT) 320-12.5 MG per tablet Take 1 tablet by mouth daily.  90 tablet  3  . zolpidem (AMBIEN) 10 MG tablet Take 0.5-1 tablets (5-10 mg total) by mouth at bedtime as needed for sleep.  30 tablet  3   Review of Systems Review of Systems    Constitutional: Negative for diaphoresis and unexpected weight change.  HENT: Negative for drooling and tinnitus.   Eyes: Negative for photophobia and visual disturbance.  Respiratory: Negative for choking and stridor.   Gastrointestinal: Negative for vomiting and blood in stool.  Genitourinary: Negative for hematuria and decreased urine volume.  Musculoskeletal: Negative for gait problem.  Skin: Negative for color change and wound.  Neurological: Negative for tremors and numbness.     Objective:   Physical Exam BP 160/88  Pulse 78  Temp 99.1 F (37.3 C) (Oral)  Ht 5\' 2"  (1.575 m)  Wt 179 lb 4 oz (81.307 kg)  BMI 32.79 kg/m2  SpO2 95% Physical Exam  VS noted, not ill appaering Constitutional: Pt appears well-developed and well-nourished.  HENT: Head: Normocephalic.  Right Ear: External ear normal.  Left Ear: External ear normal.  Eyes: Conjunctivae and EOM are normal. Pupils are equal, round, and reactive to light.  Neck: Normal range of motion. Neck supple.  Cardiovascular: Normal rate and regular rhythm.   Pulmonary/Chest: Effort normal and breath sounds normal.  Abd:  Soft, NT, non-distended, + BS - benign exam, no flank tedner Neurological: Pt is alert. Not confused  Skin: Skin is warm. No erythema.     Assessment & Plan:

## 2012-04-28 NOTE — Assessment & Plan Note (Signed)
stable overall by hx and exam, most recent data reviewed with pt, and pt to continue medical treatment as before SpO2 Readings from Last 3 Encounters:  04/27/12 95%  04/21/12 94%

## 2012-04-28 NOTE — Assessment & Plan Note (Signed)
Symptomatically improved, exam benign, to finish antibx, no cx results to review,  to f/u any worsening symptoms or concerns

## 2012-04-28 NOTE — Assessment & Plan Note (Signed)
Asympt,  to f/u any worsening symptoms or concerns, to cont work on wt loss Lab Results  Component Value Date   HGBA1C 6.5 06/03/2011

## 2012-04-28 NOTE — Assessment & Plan Note (Signed)
Hypotension resolved - to re-start meds in tiered fashion over the next 2 wks as long as SBP > 140, pt does not require refills, instructed and reassured, encouraged to f/u with PCP as well;  She has an eye towards not re-starting all of her meds, but I explained her hypotension episode was likely a transient problem related to illness and if nothing else changed such as wt status and uti resolved, she will likely require same meds re-start in the next 2 wks

## 2012-05-14 ENCOUNTER — Telehealth: Payer: Self-pay | Admitting: Internal Medicine

## 2012-05-14 DIAGNOSIS — R300 Dysuria: Secondary | ICD-10-CM

## 2012-05-14 MED ORDER — SULFAMETHOXAZOLE-TRIMETHOPRIM 800-160 MG PO TABS: 1.0000 | ORAL_TABLET | Freq: Two times a day (BID) | ORAL | Status: AC

## 2012-05-14 NOTE — Telephone Encounter (Signed)
UA and Cx Re-start Bactrim x 2 wks OV in 3 wks

## 2012-05-14 NOTE — Telephone Encounter (Signed)
Pt advised of MD's recommendations - specimen, OV and ABX.

## 2012-05-14 NOTE — Telephone Encounter (Signed)
Caller: Jessica Khan/Patient; PCP: Sonda Primes; CB#: [phone];  Call regarding body aches; pt was seen on 04/21/12 and was treated for UTI; felt much better after antibiotic; but on 05/11/12 started with the same body aches that she had when dx with UTI;  temp 99.5 this morning; denies any urinary sx; she did not have any urinary sx on 04/21/12 when dx with UTI; Triaged per Flu Like Symptoms Guideline; Homecare d/t all other situations; appt suggested for possible UTI by hx; pt agrees to appt;  pt would like to be seen today or tomorrow morning; [facility] is full for today; OFFICE PLEASE REVIEW AND CALL PT BACK TODAY REGARDING APPT

## 2012-05-15 ENCOUNTER — Other Ambulatory Visit (INDEPENDENT_AMBULATORY_CARE_PROVIDER_SITE_OTHER): Payer: PRIVATE HEALTH INSURANCE

## 2012-05-15 ENCOUNTER — Telehealth: Payer: Self-pay | Admitting: Internal Medicine

## 2012-05-15 DIAGNOSIS — R300 Dysuria: Secondary | ICD-10-CM

## 2012-05-15 LAB — URINALYSIS, ROUTINE W REFLEX MICROSCOPIC
Hgb urine dipstick: NEGATIVE
Ketones, ur: NEGATIVE
Specific Gravity, Urine: 1.015 (ref 1.000–1.030)
Urine Glucose: NEGATIVE
Urobilinogen, UA: 0.2 (ref 0.0–1.0)

## 2012-05-15 NOTE — Telephone Encounter (Signed)
On abx

## 2012-05-18 LAB — URINE CULTURE: Colony Count: >=100000

## 2012-06-01 ENCOUNTER — Telehealth: Payer: Self-pay | Admitting: *Deleted

## 2012-06-01 MED ORDER — ZOLPIDEM TARTRATE 10 MG PO TABS: 5.0000 mg | ORAL_TABLET | Freq: Every evening | ORAL | Status: DC | PRN

## 2012-06-01 NOTE — Telephone Encounter (Signed)
Rf req for zolpidem 10 mg. Last filled 04/27/12. Ok to RF?

## 2012-06-01 NOTE — Telephone Encounter (Signed)
OK to fill this prescription with additional refills x3 Thank you!  

## 2012-06-01 NOTE — Telephone Encounter (Signed)
Done

## 2012-06-04 ENCOUNTER — Other Ambulatory Visit (INDEPENDENT_AMBULATORY_CARE_PROVIDER_SITE_OTHER): Payer: PRIVATE HEALTH INSURANCE

## 2012-06-04 ENCOUNTER — Telehealth: Payer: Self-pay | Admitting: Internal Medicine

## 2012-06-04 ENCOUNTER — Encounter: Payer: Self-pay | Admitting: Internal Medicine

## 2012-06-04 ENCOUNTER — Ambulatory Visit (INDEPENDENT_AMBULATORY_CARE_PROVIDER_SITE_OTHER): Payer: PRIVATE HEALTH INSURANCE | Admitting: Internal Medicine

## 2012-06-04 VITALS — BP 138/72 | HR 76 | Temp 98.1°F | Resp 16 | Wt 179.0 lb

## 2012-06-04 DIAGNOSIS — IMO0001 Reserved for inherently not codable concepts without codable children: Secondary | ICD-10-CM

## 2012-06-04 DIAGNOSIS — I959 Hypotension, unspecified: Secondary | ICD-10-CM

## 2012-06-04 DIAGNOSIS — N3000 Acute cystitis without hematuria: Secondary | ICD-10-CM

## 2012-06-04 DIAGNOSIS — I10 Essential (primary) hypertension: Secondary | ICD-10-CM

## 2012-06-04 DIAGNOSIS — E538 Deficiency of other specified B group vitamins: Secondary | ICD-10-CM

## 2012-06-04 LAB — URINALYSIS
Bilirubin Urine: NEGATIVE
Hgb urine dipstick: NEGATIVE
Nitrite: NEGATIVE
Total Protein, Urine: NEGATIVE
Urobilinogen, UA: 0.2 (ref 0.0–1.0)

## 2012-06-04 MED ORDER — ESOMEPRAZOLE MAGNESIUM 40 MG PO CPDR: 40.0000 mg | DELAYED_RELEASE_CAPSULE | Freq: Every day | ORAL | Status: AC

## 2012-06-04 NOTE — Assessment & Plan Note (Signed)
Resolved off norvasc

## 2012-06-04 NOTE — Telephone Encounter (Signed)
Jessica Khan, please, inform patient that her UA was nl Thx

## 2012-06-04 NOTE — Telephone Encounter (Signed)
Left detailed mess informing pt of below.  

## 2012-06-04 NOTE — Assessment & Plan Note (Signed)
UA treated

## 2012-06-04 NOTE — Assessment & Plan Note (Signed)
Re-start B12 

## 2012-06-04 NOTE — Progress Notes (Signed)
Patient ID: Jessica Khan, female   DOB: September 27, 1946, 66 y.o.   MRN: [id number]  Subjective:    Patient ID: Jessica Khan, female    DOB: 12-28-1945, 66 y.o.   MRN: [id number]  HPI  Here to f/u on UTI and relative hypotension, tx with antibx. F/u on low BP and HTN - better off Norvasc. F/u on myalgias related to UTI - resolved  BP Readings from Last 3 Encounters:  06/04/12 138/72  04/27/12 160/88  04/21/12 70/50   Wt Readings from Last 3 Encounters:  06/04/12 179 lb (81.194 kg)  04/27/12 179 lb 4 oz (81.307 kg)  02/27/12 184 lb (83.462 kg)     Past Medical History  Diagnosis Date  . GERD (gastroesophageal reflux disease)   . Hypertension   . Osteoporosis   . Fibromyalgia   . IBS (irritable bowel syndrome)   . Diverticulosis of colon     Dr. Loreta Ave  . Hemangioma     T 10- Dr. Jule Ser  . CTS (carpal tunnel syndrome)     bilateral  . Allergic rhinitis   . Elevated glucose 2011  . COPD (chronic obstructive pulmonary disease)     due to smoking  . Acute cystitis 12/25/2007  . ALLERGIC RHINITIS 11/30/2009  . CAROTID BRUIT, LEFT, LOCAL 02/25/2008  . Carpal tunnel syndrome 12/23/2008  . CHEST PAIN 05/31/2010  . COPD 12/06/2010  . DIVERTICULOSIS, COLON 02/25/2008  . EAR PAIN 12/06/2010  . FIBROMYALGIA 09/25/2007  . GERD 09/25/2007  . HEMORRHOIDS, INTERNAL 05/15/2008  . HYPERGLYCEMIA 05/31/2010  . HYPERTENSION 09/25/2007  . Irritable bowel syndrome 05/19/2008  . OSTEOPOROSIS 09/25/2007  . PARESTHESIA 12/23/2008  . TOBACCO USE DISORDER/SMOKER-SMOKING CESSATION DISCUSSED 02/25/2008  . VITAMIN B12 DEFICIENCY 01/21/2009   Past Surgical History  Procedure Date  . Cholecystectomy   . Abdominal hysterectomy     partial  . Shoulder surgery     Left shoulder impengement repair  . Foot osteotomy     Right    reports that she has been smoking.  She does not have any smokeless tobacco history on file. She reports that she does not drink alcohol or use illicit drugs. family history includes  Cancer in her father and other; Diabetes in her maternal grandfather and maternal grandmother; Heart disease (age of onset:77) in her mother; and Hypertension in her other. Allergies  Allergen Reactions  . Ciprofloxacin     REACTION: nausea  . Metoprolol Succinate     REACTION: fatigue   Current Outpatient Prescriptions on File Prior to Visit  Medication Sig Dispense Refill  . aspirin 81 MG tablet Take 81 mg by mouth daily.        . cetirizine (ZYRTEC) 10 MG tablet Take 10 mg by mouth daily as needed.        . Cholecalciferol 1000 UNITS capsule Take 1,000 Units by mouth daily.        Marland Kitchen escitalopram (LEXAPRO) 20 MG tablet Take 1 tablet (20 mg total) by mouth daily.  30 tablet  5  . Magnesium Aspartate 65 MG TABS Take 1 tablet by mouth daily.        . valsartan-hydrochlorothiazide (DIOVAN-HCT) 320-12.5 MG per tablet Take 1 tablet by mouth daily.  90 tablet  3  . zolpidem (AMBIEN) 10 MG tablet Take 0.5-1 tablets (5-10 mg total) by mouth at bedtime as needed for sleep.  30 tablet  3  . DISCONTD: NEXIUM 40 MG capsule TAKE 1 CAPSULE BY MOUTH TWICE DAILY  180 capsule  1  . cyanocobalamin 1000 MCG tablet Take 100 mcg by mouth daily.        . fluticasone (FLONASE) 50 MCG/ACT nasal spray Place 1 spray into the nose daily.         Review of Systems Review of Systems  Constitutional: Negative for diaphoresis and unexpected weight change.  HENT: Negative for drooling and tinnitus.   Eyes: Negative for photophobia and visual disturbance.  Respiratory: Negative for choking and stridor.   Gastrointestinal: Negative for vomiting and blood in stool.  Genitourinary: Negative for hematuria and decreased urine volume.  Musculoskeletal: Negative for gait problem.  Skin: Negative for color change and wound.  Neurological: Negative for tremors and numbness.     Objective:   Physical Exam BP 138/72  Pulse 76  Temp 98.1 F (36.7 C) (Oral)  Resp 16  Wt 179 lb (81.194 kg) Physical Exam  VS noted, not  ill appaering Constitutional: Pt appears well-developed and well-nourished.  HENT: Head: Normocephalic.  Right Ear: External ear normal.  Left Ear: External ear normal.  Eyes: Conjunctivae and EOM are normal. Pupils are equal, round, and reactive to light.  Neck: Normal range of motion. Neck supple.  Cardiovascular: Normal rate and regular rhythm.   Pulmonary/Chest: Effort normal and breath sounds normal.  Abd:  Soft, NT, non-distended, + BS - benign exam, no flank tedner Neurological: Pt is alert. Not confused  Skin: Skin is warm. No erythema.      Lab Results  Component Value Date   WBC 5.6 12/02/2010   HGB 13.2 12/02/2010   HCT 38.5 12/02/2010   PLT 202.0 12/02/2010   GLUCOSE 95 02/27/2012   CHOL 202* 06/03/2011   TRIG 150.0* 06/03/2011   HDL 48.00 06/03/2011   LDLDIRECT 143.0 06/03/2011   LDLCALC 123* 12/02/2010   ALT 31 12/02/2010   AST 25 12/02/2010   NA 140 02/27/2012   K 4.3 02/27/2012   CL 104 02/27/2012   CREATININE 0.9 02/27/2012   BUN 20 02/27/2012   CO2 29 02/27/2012   TSH 2.10 12/02/2010   HGBA1C 6.5 06/03/2011    Assessment & Plan:

## 2012-06-04 NOTE — Assessment & Plan Note (Signed)
Continue with current prescription therapy as reflected on the Med list.  

## 2012-08-14 ENCOUNTER — Encounter: Payer: Self-pay | Admitting: Internal Medicine

## 2012-09-03 ENCOUNTER — Ambulatory Visit: Payer: 59 | Admitting: Internal Medicine

## 2012-10-12 ENCOUNTER — Other Ambulatory Visit: Payer: Self-pay | Admitting: Internal Medicine

## 2012-11-30 ENCOUNTER — Telehealth: Payer: Self-pay | Admitting: *Deleted

## 2012-11-30 MED ORDER — ZOLPIDEM TARTRATE 10 MG PO TABS: 5.0000 mg | ORAL_TABLET | Freq: Every evening | ORAL | Status: AC | PRN

## 2012-11-30 NOTE — Telephone Encounter (Signed)
OK to fill this prescription with additional refills x5 Thank you!  

## 2012-11-30 NOTE — Telephone Encounter (Signed)
Done

## 2012-11-30 NOTE — Telephone Encounter (Signed)
Rf req for Zolpidem 10 mg. Last filled 04/27/12. Ok to Rf?

## 2012-12-17 ENCOUNTER — Other Ambulatory Visit (HOSPITAL_COMMUNITY): Payer: Self-pay | Admitting: Obstetrics and Gynecology

## 2012-12-17 DIAGNOSIS — Z1231 Encounter for screening mammogram for malignant neoplasm of breast: Secondary | ICD-10-CM

## 2013-01-07 ENCOUNTER — Ambulatory Visit (HOSPITAL_COMMUNITY): Payer: Medicare Other

## 2013-01-16 ENCOUNTER — Ambulatory Visit (HOSPITAL_COMMUNITY)
Admission: RE | Admit: 2013-01-16 | Discharge: 2013-01-16 | Disposition: A | Discharge status: Home / Self Care | Payer: Medicare Other | Source: Ambulatory Visit | Attending: Obstetrics and Gynecology | Admitting: Obstetrics and Gynecology

## 2013-01-16 DIAGNOSIS — Z1231 Encounter for screening mammogram for malignant neoplasm of breast: Secondary | ICD-10-CM | POA: Insufficient documentation

## 2013-04-09 ENCOUNTER — Other Ambulatory Visit: Payer: Self-pay | Admitting: Internal Medicine

## 2013-07-16 ENCOUNTER — Other Ambulatory Visit: Payer: Self-pay | Admitting: Obstetrics and Gynecology

## 2013-12-10 ENCOUNTER — Other Ambulatory Visit: Payer: Self-pay | Admitting: Obstetrics and Gynecology

## 2013-12-10 DIAGNOSIS — N644 Mastodynia: Secondary | ICD-10-CM

## 2013-12-19 ENCOUNTER — Ambulatory Visit
Admission: RE | Admit: 2013-12-19 | Discharge: 2013-12-19 | Disposition: A | Discharge status: Home / Self Care | Payer: Medicare HMO | Source: Ambulatory Visit | Attending: Obstetrics and Gynecology | Admitting: Obstetrics and Gynecology

## 2013-12-19 DIAGNOSIS — N644 Mastodynia: Secondary | ICD-10-CM

## 2013-12-31 ENCOUNTER — Other Ambulatory Visit: Payer: Self-pay

## 2013-12-31 DIAGNOSIS — Z1231 Encounter for screening mammogram for malignant neoplasm of breast: Secondary | ICD-10-CM

## 2013-12-31 DIAGNOSIS — Z9889 Other specified postprocedural states: Secondary | ICD-10-CM

## 2014-01-23 ENCOUNTER — Ambulatory Visit
Admission: RE | Admit: 2014-01-23 | Discharge: 2014-01-23 | Disposition: A | Discharge status: Home / Self Care | Payer: Medicare HMO | Source: Ambulatory Visit

## 2014-01-23 DIAGNOSIS — Z9889 Other specified postprocedural states: Secondary | ICD-10-CM

## 2014-01-23 DIAGNOSIS — Z1231 Encounter for screening mammogram for malignant neoplasm of breast: Secondary | ICD-10-CM

## 2015-01-21 ENCOUNTER — Other Ambulatory Visit (HOSPITAL_COMMUNITY): Payer: Self-pay | Admitting: Internal Medicine

## 2015-01-21 DIAGNOSIS — Z1231 Encounter for screening mammogram for malignant neoplasm of breast: Secondary | ICD-10-CM

## 2015-01-29 ENCOUNTER — Ambulatory Visit (HOSPITAL_COMMUNITY)
Admission: RE | Admit: 2015-01-29 | Discharge: 2015-01-29 | Disposition: A | Discharge status: Home / Self Care | Payer: Medicare HMO | Source: Ambulatory Visit | Attending: Internal Medicine | Admitting: Internal Medicine

## 2015-01-29 DIAGNOSIS — Z1231 Encounter for screening mammogram for malignant neoplasm of breast: Secondary | ICD-10-CM | POA: Insufficient documentation

## 2015-12-28 ENCOUNTER — Other Ambulatory Visit: Payer: Self-pay

## 2015-12-28 DIAGNOSIS — Z1231 Encounter for screening mammogram for malignant neoplasm of breast: Secondary | ICD-10-CM

## 2016-02-04 ENCOUNTER — Ambulatory Visit: Payer: Medicare HMO

## 2016-02-16 ENCOUNTER — Ambulatory Visit
Admission: RE | Admit: 2016-02-16 | Discharge: 2016-02-16 | Disposition: A | Discharge status: Home / Self Care | Payer: Medicare HMO | Source: Ambulatory Visit

## 2016-02-16 DIAGNOSIS — Z1231 Encounter for screening mammogram for malignant neoplasm of breast: Secondary | ICD-10-CM

## 2017-01-06 ENCOUNTER — Other Ambulatory Visit: Payer: Self-pay | Admitting: Internal Medicine

## 2017-01-06 DIAGNOSIS — Z1231 Encounter for screening mammogram for malignant neoplasm of breast: Secondary | ICD-10-CM

## 2017-02-16 ENCOUNTER — Ambulatory Visit: Payer: Medicare HMO

## 2017-03-30 ENCOUNTER — Ambulatory Visit
Admission: RE | Admit: 2017-03-30 | Discharge: 2017-03-30 | Disposition: A | Discharge status: Home / Self Care | Payer: Medicare HMO | Source: Ambulatory Visit | Attending: Internal Medicine | Admitting: Internal Medicine

## 2017-03-30 DIAGNOSIS — Z1231 Encounter for screening mammogram for malignant neoplasm of breast: Secondary | ICD-10-CM

## 2019-01-07 ENCOUNTER — Other Ambulatory Visit: Payer: Self-pay | Admitting: Dermatology

## 2022-01-17 ENCOUNTER — Ambulatory Visit: Payer: Medicare HMO | Admitting: Dermatology

## 2024-01-05 ENCOUNTER — Other Ambulatory Visit (HOSPITAL_COMMUNITY): Payer: Self-pay

## 2024-01-05 MED ORDER — PREGABALIN 75 MG PO CAPS
75.0000 mg | ORAL_CAPSULE | Freq: Two times a day (BID) | ORAL | 1 refills | Status: DC
  Filled 2024-01-05 – 2024-01-09 (×2): qty 180, 90d supply, fill #0

## 2024-01-09 ENCOUNTER — Other Ambulatory Visit (HOSPITAL_COMMUNITY): Payer: Self-pay

## 2024-01-09 ENCOUNTER — Other Ambulatory Visit (HOSPITAL_BASED_OUTPATIENT_CLINIC_OR_DEPARTMENT_OTHER): Payer: Self-pay

## 2024-01-10 ENCOUNTER — Other Ambulatory Visit (HOSPITAL_COMMUNITY): Payer: Self-pay

## 2024-01-18 ENCOUNTER — Other Ambulatory Visit (HOSPITAL_COMMUNITY): Payer: Self-pay

## 2024-01-18 MED ORDER — LEVOTHYROXINE SODIUM 88 MCG PO TABS
88.0000 ug | ORAL_TABLET | Freq: Every day | ORAL | 1 refills | Status: DC
  Filled 2024-01-18: qty 90, 90d supply, fill #0
  Filled 2024-04-11: qty 90, 90d supply, fill #1

## 2024-01-22 ENCOUNTER — Other Ambulatory Visit (HOSPITAL_COMMUNITY): Payer: Self-pay

## 2024-01-24 ENCOUNTER — Other Ambulatory Visit (HOSPITAL_COMMUNITY): Payer: Self-pay

## 2024-01-25 ENCOUNTER — Other Ambulatory Visit: Payer: Self-pay

## 2024-01-25 ENCOUNTER — Other Ambulatory Visit (HOSPITAL_COMMUNITY): Payer: Self-pay

## 2024-01-25 MED ORDER — MOUNJARO 5 MG/0.5ML ~~LOC~~ SOAJ
5.0000 mg | SUBCUTANEOUS | 3 refills | Status: DC
  Filled 2024-01-25: qty 6, 84d supply, fill #0
  Filled 2024-04-15: qty 6, 84d supply, fill #1
  Filled 2024-07-08: qty 6, 84d supply, fill #2

## 2024-02-12 ENCOUNTER — Other Ambulatory Visit (HOSPITAL_COMMUNITY): Payer: Self-pay

## 2024-02-12 MED ORDER — EZETIMIBE 10 MG PO TABS
10.0000 mg | ORAL_TABLET | Freq: Every day | ORAL | 0 refills | Status: DC
  Filled 2024-02-12: qty 90, 90d supply, fill #0

## 2024-02-13 ENCOUNTER — Other Ambulatory Visit: Payer: Self-pay

## 2024-03-19 ENCOUNTER — Other Ambulatory Visit (HOSPITAL_COMMUNITY): Payer: Self-pay

## 2024-03-22 ENCOUNTER — Other Ambulatory Visit (HOSPITAL_COMMUNITY): Payer: Self-pay

## 2024-03-22 ENCOUNTER — Other Ambulatory Visit: Payer: Self-pay

## 2024-03-22 MED ORDER — IRBESARTAN 150 MG PO TABS
150.0000 mg | ORAL_TABLET | Freq: Every evening | ORAL | 3 refills | Status: AC
  Filled 2024-03-22: qty 90, 90d supply, fill #0
  Filled 2024-06-17: qty 90, 90d supply, fill #1
  Filled 2024-09-14: qty 90, 90d supply, fill #2
  Filled 2024-12-13: qty 90, 90d supply, fill #3

## 2024-03-22 MED ORDER — TIZANIDINE HCL 4 MG PO TABS
4.0000 mg | ORAL_TABLET | Freq: Four times a day (QID) | ORAL | 0 refills | Status: AC | PRN
  Filled 2024-03-22: qty 30, 8d supply, fill #0

## 2024-04-02 ENCOUNTER — Other Ambulatory Visit (HOSPITAL_COMMUNITY): Payer: Self-pay

## 2024-04-10 ENCOUNTER — Other Ambulatory Visit: Payer: Self-pay

## 2024-04-10 ENCOUNTER — Other Ambulatory Visit (HOSPITAL_COMMUNITY): Payer: Self-pay

## 2024-04-10 MED ORDER — PREGABALIN 75 MG PO CAPS
75.0000 mg | ORAL_CAPSULE | Freq: Two times a day (BID) | ORAL | 0 refills | Status: DC
  Filled 2024-04-10: qty 180, 90d supply, fill #0

## 2024-04-10 MED ORDER — DULOXETINE HCL 60 MG PO CPEP
60.0000 mg | ORAL_CAPSULE | Freq: Every day | ORAL | 0 refills | Status: DC
  Filled 2024-04-10: qty 90, 90d supply, fill #0

## 2024-04-12 ENCOUNTER — Other Ambulatory Visit (HOSPITAL_COMMUNITY): Payer: Self-pay

## 2024-04-22 ENCOUNTER — Other Ambulatory Visit: Payer: Self-pay

## 2024-04-22 ENCOUNTER — Other Ambulatory Visit (HOSPITAL_COMMUNITY): Payer: Self-pay

## 2024-04-22 MED ORDER — PANTOPRAZOLE SODIUM 40 MG PO TBEC
40.0000 mg | DELAYED_RELEASE_TABLET | Freq: Every day | ORAL | 1 refills | Status: DC
  Filled 2024-04-22: qty 90, 90d supply, fill #0
  Filled 2024-07-15: qty 90, 90d supply, fill #1

## 2024-04-22 MED ORDER — HYDROCHLOROTHIAZIDE 25 MG PO TABS
37.5000 mg | ORAL_TABLET | Freq: Every day | ORAL | 3 refills | Status: AC
  Filled 2024-04-22: qty 135, 90d supply, fill #0
  Filled 2024-07-15: qty 135, 90d supply, fill #1
  Filled 2024-10-13: qty 135, 90d supply, fill #2

## 2024-05-06 ENCOUNTER — Other Ambulatory Visit (HOSPITAL_COMMUNITY): Payer: Self-pay

## 2024-05-06 ENCOUNTER — Other Ambulatory Visit: Payer: Self-pay

## 2024-05-06 MED ORDER — EZETIMIBE 10 MG PO TABS
10.0000 mg | ORAL_TABLET | Freq: Every day | ORAL | 0 refills | Status: DC
  Filled 2024-05-06: qty 90, 90d supply, fill #0

## 2024-07-02 ENCOUNTER — Other Ambulatory Visit (HOSPITAL_COMMUNITY): Payer: Self-pay

## 2024-07-02 MED ORDER — PREGABALIN 75 MG PO CAPS
75.0000 mg | ORAL_CAPSULE | Freq: Two times a day (BID) | ORAL | 0 refills | Status: DC
  Filled 2024-07-08: qty 180, 90d supply, fill #0

## 2024-07-08 ENCOUNTER — Other Ambulatory Visit: Payer: Self-pay

## 2024-07-08 ENCOUNTER — Other Ambulatory Visit (HOSPITAL_COMMUNITY): Payer: Self-pay

## 2024-07-10 ENCOUNTER — Other Ambulatory Visit (HOSPITAL_COMMUNITY): Payer: Self-pay

## 2024-07-10 MED ORDER — LEVOTHYROXINE SODIUM 88 MCG PO TABS
88.0000 ug | ORAL_TABLET | Freq: Every day | ORAL | 0 refills | Status: DC
  Filled 2024-07-10: qty 30, 30d supply, fill #0

## 2024-07-17 ENCOUNTER — Other Ambulatory Visit (HOSPITAL_COMMUNITY): Payer: Self-pay

## 2024-07-18 ENCOUNTER — Other Ambulatory Visit (HOSPITAL_COMMUNITY): Payer: Self-pay

## 2024-07-18 ENCOUNTER — Other Ambulatory Visit: Payer: Self-pay

## 2024-07-18 MED ORDER — DULOXETINE HCL 60 MG PO CPEP
60.0000 mg | ORAL_CAPSULE | Freq: Every day | ORAL | 3 refills | Status: DC
  Filled 2024-07-18 – 2024-07-22 (×4): qty 90, 90d supply, fill #0

## 2024-07-18 MED ORDER — PREGABALIN 75 MG PO CAPS
75.0000 mg | ORAL_CAPSULE | Freq: Two times a day (BID) | ORAL | 0 refills | Status: DC
  Filled 2024-07-18: qty 180, 90d supply, fill #0

## 2024-07-19 ENCOUNTER — Other Ambulatory Visit (HOSPITAL_COMMUNITY): Payer: Self-pay

## 2024-07-20 ENCOUNTER — Other Ambulatory Visit (HOSPITAL_COMMUNITY): Payer: Self-pay

## 2024-07-22 ENCOUNTER — Other Ambulatory Visit: Payer: Self-pay

## 2024-08-04 ENCOUNTER — Other Ambulatory Visit (HOSPITAL_COMMUNITY): Payer: Self-pay

## 2024-08-05 ENCOUNTER — Other Ambulatory Visit (HOSPITAL_COMMUNITY): Payer: Self-pay

## 2024-08-05 ENCOUNTER — Other Ambulatory Visit: Payer: Self-pay

## 2024-08-05 MED ORDER — EZETIMIBE 10 MG PO TABS
10.0000 mg | ORAL_TABLET | Freq: Every day | ORAL | 0 refills | Status: DC
  Filled 2024-08-05: qty 90, 90d supply, fill #0

## 2024-08-08 ENCOUNTER — Other Ambulatory Visit (HOSPITAL_COMMUNITY): Payer: Self-pay

## 2024-08-08 MED ORDER — MOUNJARO 7.5 MG/0.5ML ~~LOC~~ SOAJ
7.5000 mg | SUBCUTANEOUS | 0 refills | Status: DC
  Filled 2024-08-08: qty 2, 28d supply, fill #0

## 2024-08-09 ENCOUNTER — Other Ambulatory Visit (HOSPITAL_COMMUNITY): Payer: Self-pay

## 2024-08-09 MED ORDER — LEVOTHYROXINE SODIUM 88 MCG PO TABS
88.0000 ug | ORAL_TABLET | Freq: Every day | ORAL | 2 refills | Status: AC
Start: 2024-08-09 — End: ?
  Filled 2024-08-09: qty 90, 90d supply, fill #0
  Filled 2024-10-31: qty 90, 90d supply, fill #1

## 2024-08-29 ENCOUNTER — Other Ambulatory Visit: Payer: Self-pay

## 2024-08-29 ENCOUNTER — Other Ambulatory Visit (HOSPITAL_COMMUNITY): Payer: Self-pay

## 2024-08-29 MED ORDER — MOUNJARO 7.5 MG/0.5ML ~~LOC~~ SOAJ
7.5000 mg | SUBCUTANEOUS | 0 refills | Status: DC
  Filled 2024-08-29: qty 2, 28d supply, fill #0

## 2024-09-26 ENCOUNTER — Other Ambulatory Visit (HOSPITAL_COMMUNITY): Payer: Self-pay

## 2024-09-26 ENCOUNTER — Other Ambulatory Visit: Payer: Self-pay

## 2024-09-26 MED ORDER — MOUNJARO 7.5 MG/0.5ML ~~LOC~~ SOAJ
7.5000 mg | SUBCUTANEOUS | 0 refills | Status: DC
  Filled 2024-09-26: qty 2, 28d supply, fill #0

## 2024-10-13 ENCOUNTER — Other Ambulatory Visit (HOSPITAL_COMMUNITY): Payer: Self-pay

## 2024-10-14 ENCOUNTER — Other Ambulatory Visit (HOSPITAL_COMMUNITY): Payer: Self-pay

## 2024-10-16 ENCOUNTER — Other Ambulatory Visit (HOSPITAL_COMMUNITY): Payer: Self-pay

## 2024-10-16 MED ORDER — PANTOPRAZOLE SODIUM 40 MG PO TBEC
40.0000 mg | DELAYED_RELEASE_TABLET | Freq: Every day | ORAL | 3 refills | Status: AC
  Filled 2024-10-16: qty 90, 90d supply, fill #0

## 2024-10-17 ENCOUNTER — Other Ambulatory Visit: Payer: Self-pay

## 2024-10-17 ENCOUNTER — Other Ambulatory Visit (HOSPITAL_COMMUNITY): Payer: Self-pay

## 2024-10-17 MED ORDER — PREGABALIN 75 MG PO CAPS
75.0000 mg | ORAL_CAPSULE | Freq: Two times a day (BID) | ORAL | 0 refills | Status: AC
  Filled 2024-10-17: qty 180, 90d supply, fill #0

## 2024-10-18 ENCOUNTER — Other Ambulatory Visit (HOSPITAL_COMMUNITY): Payer: Self-pay

## 2024-10-18 MED ORDER — DULOXETINE HCL 60 MG PO CPEP
60.0000 mg | ORAL_CAPSULE | Freq: Every day | ORAL | 1 refills | Status: AC
  Filled 2024-10-18: qty 90, 90d supply, fill #0

## 2024-10-21 ENCOUNTER — Other Ambulatory Visit (HOSPITAL_COMMUNITY): Payer: Self-pay

## 2024-10-21 ENCOUNTER — Other Ambulatory Visit: Payer: Self-pay

## 2024-10-21 MED ORDER — MOUNJARO 7.5 MG/0.5ML ~~LOC~~ SOAJ
7.5000 mg | SUBCUTANEOUS | 0 refills | Status: DC
  Filled 2024-10-21: qty 2, 28d supply, fill #0

## 2024-10-22 ENCOUNTER — Other Ambulatory Visit (HOSPITAL_COMMUNITY): Payer: Self-pay

## 2024-10-23 ENCOUNTER — Other Ambulatory Visit (HOSPITAL_COMMUNITY): Payer: Self-pay

## 2024-11-02 ENCOUNTER — Other Ambulatory Visit (HOSPITAL_COMMUNITY): Payer: Self-pay

## 2024-11-06 ENCOUNTER — Other Ambulatory Visit (HOSPITAL_COMMUNITY): Payer: Self-pay

## 2024-11-06 MED ORDER — EZETIMIBE 10 MG PO TABS
10.0000 mg | ORAL_TABLET | Freq: Every day | ORAL | 0 refills | Status: AC
  Filled 2024-11-06: qty 90, 90d supply, fill #0

## 2024-11-08 ENCOUNTER — Other Ambulatory Visit (HOSPITAL_COMMUNITY): Payer: Self-pay

## 2024-11-08 ENCOUNTER — Other Ambulatory Visit (HOSPITAL_BASED_OUTPATIENT_CLINIC_OR_DEPARTMENT_OTHER): Payer: Self-pay

## 2024-11-08 ENCOUNTER — Other Ambulatory Visit: Payer: Self-pay

## 2024-11-08 MED ORDER — CETIRIZINE HCL 10 MG PO TABS
10.0000 mg | ORAL_TABLET | Freq: Every day | ORAL | 3 refills | Status: AC
  Filled 2024-11-08: qty 90, 90d supply, fill #0

## 2024-11-08 MED ORDER — SENNOSIDES 8.6 MG PO TABS
1.0000 | ORAL_TABLET | Freq: Every day | ORAL | 1 refills | Status: AC
  Filled 2024-11-08: qty 90, 90d supply, fill #0

## 2024-11-15 ENCOUNTER — Other Ambulatory Visit: Payer: Self-pay

## 2024-11-15 ENCOUNTER — Other Ambulatory Visit (HOSPITAL_COMMUNITY): Payer: Self-pay

## 2024-11-18 ENCOUNTER — Other Ambulatory Visit (HOSPITAL_COMMUNITY): Payer: Self-pay

## 2024-11-19 ENCOUNTER — Other Ambulatory Visit (HOSPITAL_COMMUNITY): Payer: Self-pay

## 2024-11-19 MED ORDER — MOUNJARO 7.5 MG/0.5ML ~~LOC~~ SOAJ
SUBCUTANEOUS | 0 refills | Status: DC
  Filled 2024-11-19: qty 2, 28d supply, fill #0

## 2024-11-20 ENCOUNTER — Other Ambulatory Visit (HOSPITAL_COMMUNITY): Payer: Self-pay

## 2024-11-20 MED ORDER — REPATHA SURECLICK 140 MG/ML ~~LOC~~ SOAJ
140.0000 mg | SUBCUTANEOUS | 0 refills | Status: AC
  Filled 2024-11-20: qty 6, 84d supply, fill #0

## 2024-12-11 ENCOUNTER — Other Ambulatory Visit (HOSPITAL_COMMUNITY): Payer: Self-pay

## 2024-12-13 ENCOUNTER — Other Ambulatory Visit: Payer: Self-pay

## 2024-12-13 ENCOUNTER — Other Ambulatory Visit (HOSPITAL_COMMUNITY): Payer: Self-pay

## 2024-12-13 MED ORDER — TIZANIDINE HCL 4 MG PO TABS
4.0000 mg | ORAL_TABLET | Freq: Four times a day (QID) | ORAL | 0 refills | Status: AC | PRN
  Filled 2024-12-13: qty 30, 8d supply, fill #0

## 2024-12-13 MED ORDER — MOUNJARO 7.5 MG/0.5ML ~~LOC~~ SOAJ
7.5000 mg | SUBCUTANEOUS | 0 refills | Status: AC
  Filled 2024-12-13: qty 2, 28d supply, fill #0

## 2024-12-17 ENCOUNTER — Encounter (HOSPITAL_COMMUNITY): Payer: Self-pay

## 2024-12-17 ENCOUNTER — Other Ambulatory Visit (HOSPITAL_COMMUNITY): Payer: Self-pay

## 2024-12-17 MED ORDER — REPATHA SURECLICK 140 MG/ML ~~LOC~~ SOAJ
140.0000 mg | SUBCUTANEOUS | 4 refills | Status: AC
  Filled 2024-12-17: qty 6, 84d supply, fill #0
